# Patient Record
Sex: Male | Born: 1970 | Race: Black or African American | Hispanic: No | Marital: Single | Smoking: Never smoker
Health system: Southern US, Community
[De-identification: ages and names within clinical notes are randomized; demographics above are authoritative.]

## PROBLEM LIST (undated history)

## (undated) DIAGNOSIS — M1990 Unspecified osteoarthritis, unspecified site: Secondary | ICD-10-CM

## (undated) DIAGNOSIS — T7840XA Allergy, unspecified, initial encounter: Secondary | ICD-10-CM

## (undated) DIAGNOSIS — I10 Essential (primary) hypertension: Secondary | ICD-10-CM

## (undated) HISTORY — DX: Unspecified osteoarthritis, unspecified site: M19.90

## (undated) HISTORY — PX: OTHER SURGICAL HISTORY: SHX169

## (undated) HISTORY — DX: Essential (primary) hypertension: I10

## (undated) HISTORY — DX: Allergy, unspecified, initial encounter: T78.40XA

---
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
## 2000-02-16 ENCOUNTER — Emergency Department (HOSPITAL_COMMUNITY): Admission: EM | Admit: 2000-02-16 | Discharge: 2000-02-16 | Payer: Self-pay | Admitting: Emergency Medicine

## 2000-02-23 ENCOUNTER — Emergency Department (HOSPITAL_COMMUNITY): Admission: EM | Admit: 2000-02-23 | Discharge: 2000-02-23 | Payer: Self-pay | Admitting: Emergency Medicine

## 2005-12-09 ENCOUNTER — Emergency Department (HOSPITAL_COMMUNITY): Admission: EM | Admit: 2005-12-09 | Discharge: 2005-12-09 | Payer: Self-pay | Admitting: Emergency Medicine

## 2008-10-24 ENCOUNTER — Emergency Department (HOSPITAL_COMMUNITY): Admission: EM | Admit: 2008-10-24 | Discharge: 2008-10-24 | Payer: Self-pay | Admitting: Emergency Medicine

## 2012-02-19 ENCOUNTER — Emergency Department (HOSPITAL_BASED_OUTPATIENT_CLINIC_OR_DEPARTMENT_OTHER)
Admission: EM | Admit: 2012-02-19 | Discharge: 2012-02-19 | Disposition: A | Discharge status: Home / Self Care | Payer: Self-pay | Attending: Emergency Medicine | Admitting: Emergency Medicine

## 2012-02-19 ENCOUNTER — Encounter (HOSPITAL_BASED_OUTPATIENT_CLINIC_OR_DEPARTMENT_OTHER): Payer: Self-pay

## 2012-02-19 ENCOUNTER — Emergency Department (HOSPITAL_BASED_OUTPATIENT_CLINIC_OR_DEPARTMENT_OTHER): Payer: Self-pay

## 2012-02-19 DIAGNOSIS — S0530XA Ocular laceration without prolapse or loss of intraocular tissue, unspecified eye, initial encounter: Secondary | ICD-10-CM | POA: Insufficient documentation

## 2012-02-19 DIAGNOSIS — Y9301 Activity, walking, marching and hiking: Secondary | ICD-10-CM | POA: Insufficient documentation

## 2012-02-19 DIAGNOSIS — S0285XA Fracture of orbit, unspecified, initial encounter for closed fracture: Secondary | ICD-10-CM

## 2012-02-19 DIAGNOSIS — S0590XA Unspecified injury of unspecified eye and orbit, initial encounter: Secondary | ICD-10-CM

## 2012-02-19 DIAGNOSIS — F17200 Nicotine dependence, unspecified, uncomplicated: Secondary | ICD-10-CM | POA: Insufficient documentation

## 2012-02-19 DIAGNOSIS — Y929 Unspecified place or not applicable: Secondary | ICD-10-CM | POA: Insufficient documentation

## 2012-02-19 DIAGNOSIS — S04019A Injury of optic nerve, unspecified eye, initial encounter: Secondary | ICD-10-CM

## 2012-02-19 DIAGNOSIS — IMO0002 Reserved for concepts with insufficient information to code with codable children: Secondary | ICD-10-CM | POA: Insufficient documentation

## 2012-02-19 MED ORDER — CEFAZOLIN SODIUM 1-5 GM-% IV SOLN
1.0000 g | Freq: Three times a day (TID) | INTRAVENOUS | Status: DC
  Administered 2012-02-19: 1 g via INTRAVENOUS
  Filled 2012-02-19: qty 50

## 2012-02-19 MED ORDER — ACETAMINOPHEN 325 MG PO TABS
650.0000 mg | ORAL_TABLET | Freq: Four times a day (QID) | ORAL | Status: DC | PRN
  Administered 2012-02-19: 650 mg via ORAL
  Filled 2012-02-19: qty 2

## 2012-02-19 MED ORDER — SODIUM CHLORIDE 0.9 % IV SOLN
INTRAVENOUS | Status: DC
  Administered 2012-02-19: 08:00:00 via INTRAVENOUS

## 2012-02-19 MED ORDER — TOBRAMYCIN-DEXAMETHASONE 0.3-0.1 % OP SUSP: 1.0000 [drp] | OPHTHALMIC | Status: DC

## 2012-02-19 MED ORDER — TETANUS-DIPHTH-ACELL PERTUSSIS 5-2.5-18.5 LF-MCG/0.5 IM SUSP
0.5000 mL | Freq: Once | INTRAMUSCULAR | Status: AC
  Administered 2012-02-19: 0.5 mL via INTRAMUSCULAR
  Filled 2012-02-19: qty 0.5

## 2012-02-19 MED ORDER — OXYCODONE-ACETAMINOPHEN 5-325 MG PO TABS: 1.0000 | ORAL_TABLET | ORAL | Status: DC | PRN

## 2012-02-19 MED ORDER — ONDANSETRON HCL 4 MG/2ML IJ SOLN
4.0000 mg | Freq: Once | INTRAMUSCULAR | Status: AC
  Administered 2012-02-19: 4 mg via INTRAVENOUS

## 2012-02-19 MED ORDER — TOBRAMYCIN-DEXAMETHASONE 0.3-0.1 % OP SUSP
1.0000 [drp] | OPHTHALMIC | Status: DC
  Administered 2012-02-19: 1 [drp] via OPHTHALMIC
  Filled 2012-02-19: qty 2.5

## 2012-02-19 MED ORDER — ONDANSETRON HCL 4 MG/2ML IJ SOLN
INTRAMUSCULAR | Status: AC
  Administered 2012-02-19: 4 mg via INTRAVENOUS
  Filled 2012-02-19: qty 2

## 2012-02-19 MED ORDER — MORPHINE SULFATE 4 MG/ML IJ SOLN
4.0000 mg | Freq: Once | INTRAMUSCULAR | Status: AC
  Administered 2012-02-19: 4 mg via INTRAVENOUS
  Filled 2012-02-19: qty 1

## 2012-02-19 NOTE — ED Provider Notes (Signed)
Patient was transferred from [facility] to see ophthalmology. Seen in ER by Dr. Luciana Axe. He saw the patient primarily and the patient will be discharged  Juliet Rude. Rubin Payor, MD 02/19/12 1243

## 2012-02-19 NOTE — ED Notes (Signed)
Patient transported to CT 

## 2012-02-19 NOTE — ED Notes (Signed)
opthamology at bedside

## 2012-02-19 NOTE — ED Notes (Signed)
Pt has returned from radiology.  
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
## 2012-02-19 NOTE — ED Provider Notes (Signed)
Pt seen with resident  Pt here with eye injury He appears to have scleral laceration and CT imaging shows orbital fractures (no entrapment) and optic nerve tethering i have spoken to dr Luciana Axe - recommend IV antitbiotics, no pressure to eye and will see in [facility] ED today He does not recommend topical antibiotics D/[facility] who will [facility] patient  I will also call ENT for orbital fractures (d/w dr shoemaker, aware of patient)   Joya Gaskins, MD 02/19/12 [phone]
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
## 2012-02-19 NOTE — ED Provider Notes (Signed)
I have personally seen and examined the patient.  I have discussed the plan of care with the resident.  I have reviewed the documentation on PMH/FH/Soc. History.  I have reviewed the documentation of the resident and agree.    Joya Gaskins, MD 02/19/12 [phone]

## 2012-02-26 ENCOUNTER — Emergency Department (HOSPITAL_COMMUNITY): Payer: Self-pay

## 2012-02-26 ENCOUNTER — Emergency Department (HOSPITAL_COMMUNITY)
Admission: EM | Admit: 2012-02-26 | Discharge: 2012-02-27 | Disposition: A | Discharge status: Home / Self Care | Payer: Self-pay | Attending: Emergency Medicine | Admitting: Emergency Medicine

## 2012-02-26 DIAGNOSIS — R002 Palpitations: Secondary | ICD-10-CM | POA: Insufficient documentation

## 2012-02-26 DIAGNOSIS — Z7982 Long term (current) use of aspirin: Secondary | ICD-10-CM | POA: Insufficient documentation

## 2012-02-26 DIAGNOSIS — Z79899 Other long term (current) drug therapy: Secondary | ICD-10-CM | POA: Insufficient documentation

## 2012-02-26 DIAGNOSIS — F17200 Nicotine dependence, unspecified, uncomplicated: Secondary | ICD-10-CM | POA: Insufficient documentation

## 2012-02-26 DIAGNOSIS — I10 Essential (primary) hypertension: Secondary | ICD-10-CM | POA: Insufficient documentation

## 2012-02-26 MED ORDER — ASPIRIN 325 MG PO TABS
325.0000 mg | ORAL_TABLET | ORAL | Status: AC
  Administered 2012-02-26: 325 mg via ORAL
  Filled 2012-02-26: qty 1

## 2012-02-26 MED ORDER — METOPROLOL TARTRATE 50 MG PO TABS: 50.0000 mg | ORAL_TABLET | Freq: Two times a day (BID) | ORAL | Status: DC

## 2012-02-26 MED ORDER — ASPIRIN EC 325 MG PO TBEC: 325.0000 mg | DELAYED_RELEASE_TABLET | Freq: Every day | ORAL | Status: DC

## 2012-02-26 NOTE — ED Notes (Signed)
Patient is refusing labs to be drawn . Request to be discharged made ed physician aware. Discharge in process

## 2012-02-26 NOTE — ED Notes (Signed)
Correction to prior entry pt now states he doesn't have a headache

## 2012-02-26 NOTE — ED Provider Notes (Signed)
History     CSN: [id number]  Arrival date & time 02/26/12  Jose Mcgee   First MD Initiated Contact with Patient 02/26/12 2245      Chief Complaint  Patient presents with  . Hypertension    (Consider location/radiation/quality/duration/timing/severity/associated sxs/prior treatment) HPI This 42 year old male had a spell for about 15 minutes of palpitations felt as if his heart was racing with sudden onset and gradual resolution with no associated chest pain shortness breath lightheadedness focal neurologic symptoms or other concerns, since his blood pressure was recently elevated when he had eye trauma; he came to the ED tonight to get his blood pressure rechecked. There is no treatment prior to arrival. The patient is currently asymptomatic but he noticed while lying in the gurney with his significant other in the ED if he changes positions a certain way he notices some slight tenderness to his left upper chest wall. No past medical history on file.  No past surgical history on file.  No family history on file.  History  Substance Use Topics  . Smoking status: Current Some Day Smoker    Types: Cigarettes  . Smokeless tobacco: Never Used  . Alcohol Use: Yes      Review of Systems 10 Systems reviewed and are negative for acute change except as noted in the HPI. Allergies  Review of patient's allergies indicates no known allergies.  Home Medications   Current Outpatient Rx  Name  Route  Sig  Dispense  Refill  . oxyCODONE-acetaminophen (PERCOCET) 5-325 MG per tablet   Oral   Take 1-2 tablets by mouth every 4 (four) hours as needed for pain.   20 tablet   0   . tobramycin-dexamethasone (TOBRADEX) ophthalmic solution   Right Eye   Place 1 drop into the right eye every 4 (four) hours while awake.   5 mL   0   . aspirin EC 325 MG tablet   Oral   Take 1 tablet (325 mg total) by mouth daily.   30 tablet   0   . metoprolol (LOPRESSOR) 50 MG tablet   Oral   Take 1 tablet  (50 mg total) by mouth 2 (two) times daily.   60 tablet   0     BP 175/107  Pulse 66  Temp(Src) 97.4 F (36.3 C) (Oral)  Resp 20  SpO2 97%  Physical Exam  Nursing note and vitals reviewed. Constitutional:  Awake, alert, nontoxic appearance.  HENT:  Head: Atraumatic.  Eyes: Right eye exhibits no discharge. Left eye exhibits no discharge.  Neck: Neck supple.  Cardiovascular: Normal rate and regular rhythm.   No murmur heard. Pulmonary/Chest: Effort normal and breath sounds normal. No respiratory distress. He has no wheezes. He has no rales. He exhibits tenderness.  Slight tenderness to left upper outer chest wall near his distal clavicle reproduces his pain without rash or palpable deformity  Abdominal: Soft. There is no tenderness. There is no rebound.  Musculoskeletal: He exhibits no edema and no tenderness.  Baseline ROM, no obvious new focal weakness.  Neurological: He is alert.  Mental status and motor strength appears baseline for patient and situation.  Skin: No rash noted.  Psychiatric: He has a normal mood and affect.    ED Course  Procedures (including critical care time) ECG: Sinus rhythm, ventricular rate 78, normal axis, borderline prolonged QT interval, no acute ischemic changes noted, no comparison ECG available  Labs Reviewed - No data to display No results found.   1.  Palpitations   2. Hypertension       MDM  Pt stable in ED with no significant deterioration in condition.  Patient / Family / Caregiver informed of clinical course, understand medical decision-making process, and agree with plan.  I doubt any other EMC precluding discharge at this time including, but not necessarily limited to the following:Vtach.        Hurman Horn, MD 03/06/12 1710
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
## 2012-02-26 NOTE — ED Notes (Signed)
Pt states he does not want blood drawn til he can see his EKG. Nurse made aware

## 2012-02-26 NOTE — ED Notes (Signed)
Pt states [facility] and his blood pressure was high but he states he wasn't told anything about following up on his high blood pressure,  Pt has a Headache,  He is alert and oriented

## 2014-09-27 IMAGING — CR DG CHEST 2V
2 series · 2 of 2 positions shown · non-contrast
Comparison: No priors.

CLINICAL DATA: Chest pain and palpitations.  Hypertension.

CHEST - 2 VIEW

[w chest pa]
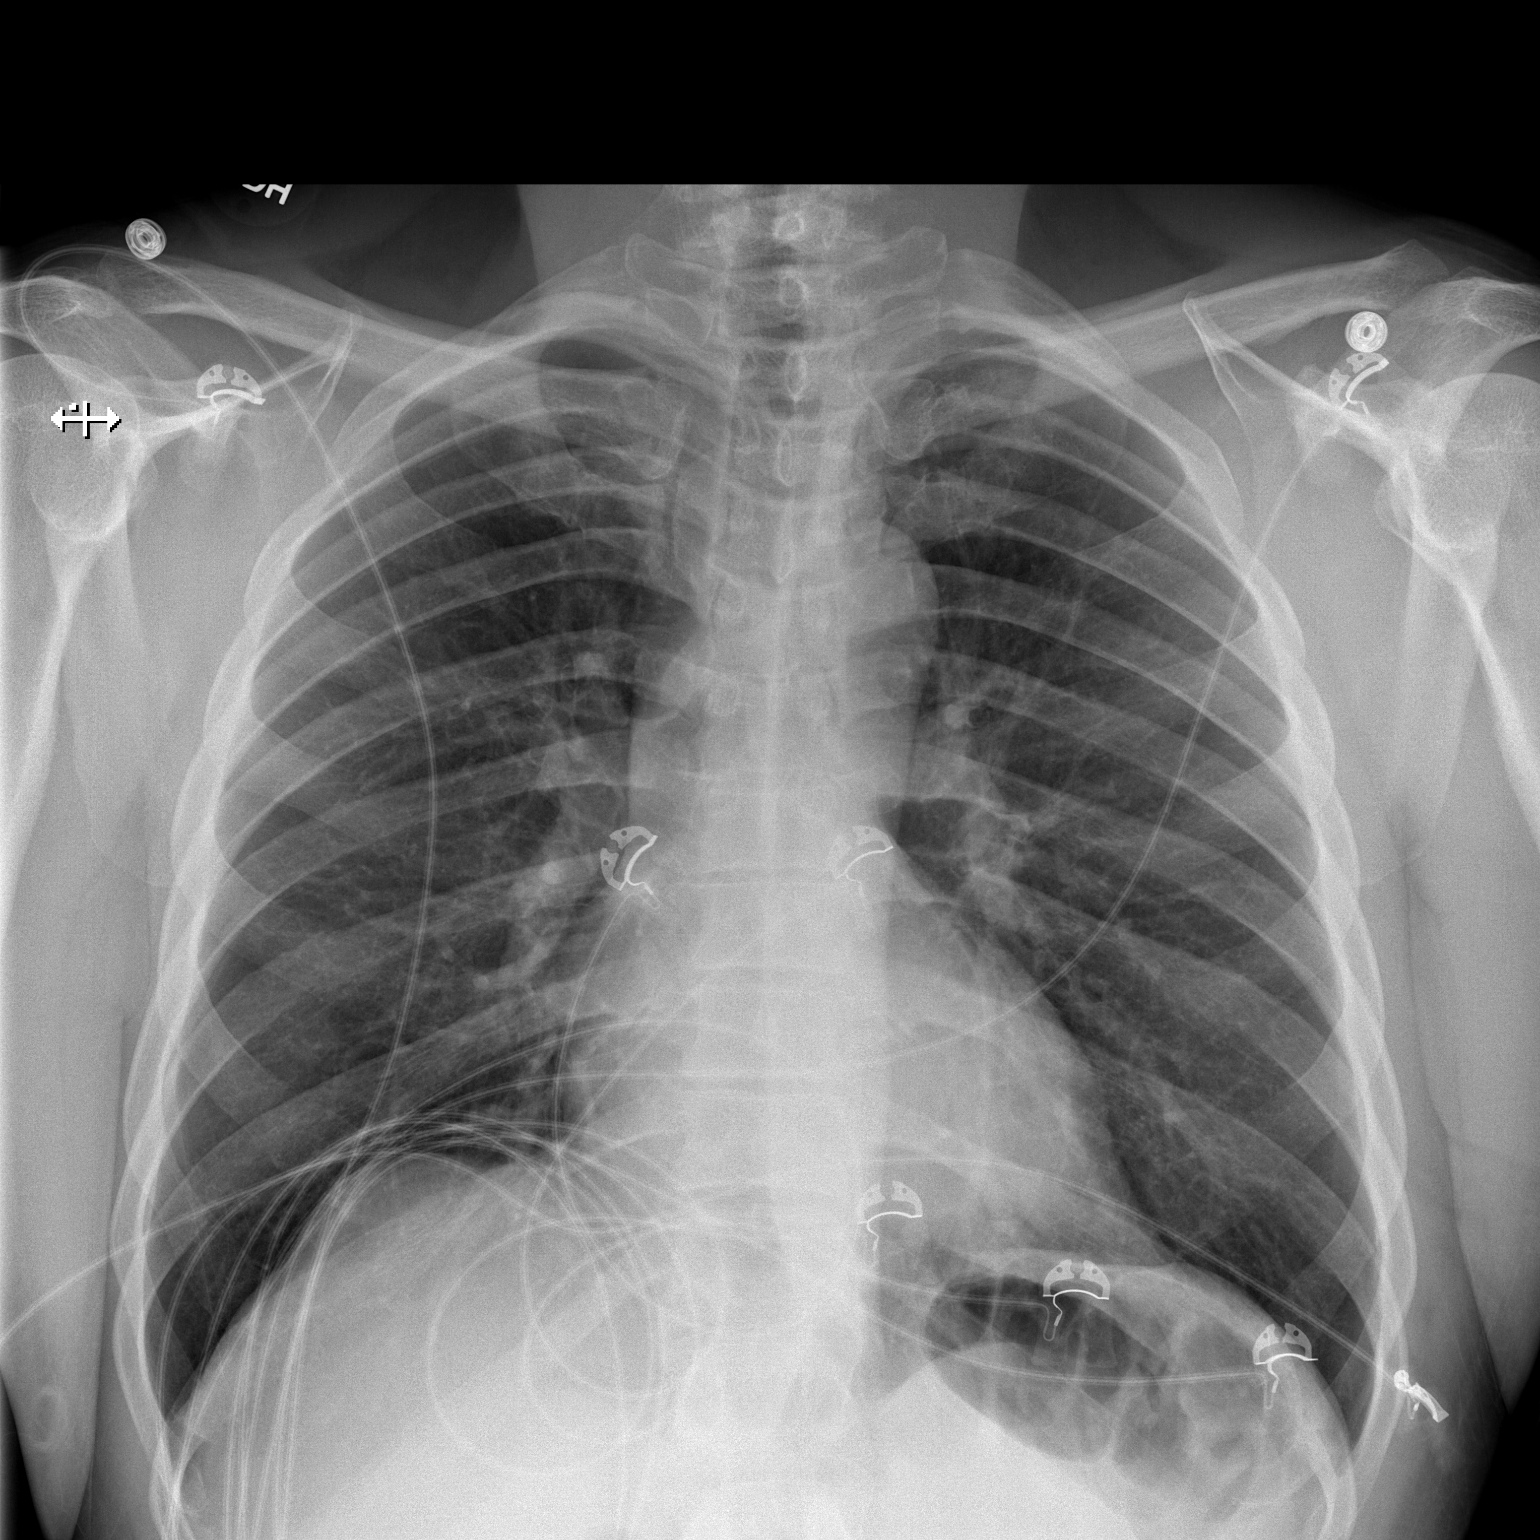

[w chest lat]
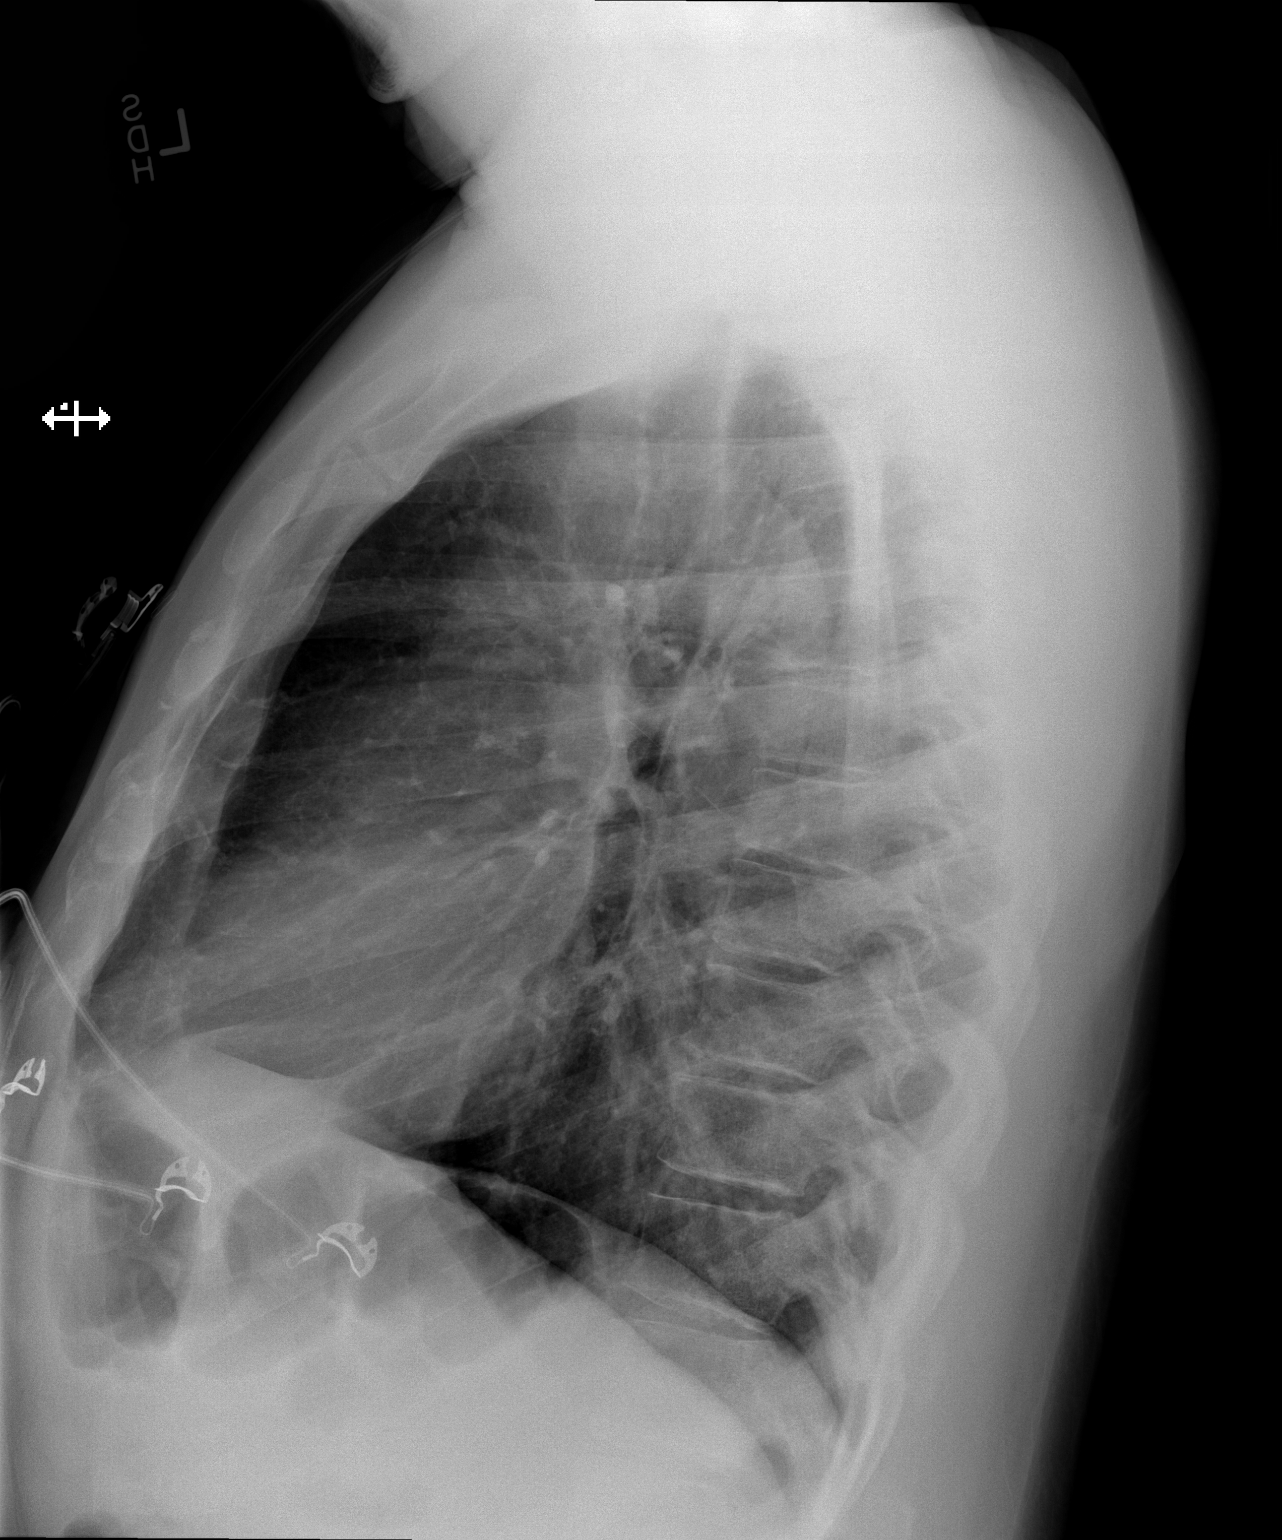

[2 of 2 positions shown; findings below may reference images not displayed]

FINDINGS: Lung volumes are normal.  No consolidative airspace
disease.  No pleural effusions.  No pneumothorax.  No pulmonary
nodule or mass noted.  Pulmonary vasculature and the
cardiomediastinal silhouette are within normal limits.
Atherosclerosis of the thoracic aorta.
IMPRESSION: 1. No radiographic evidence of acute cardiopulmonary disease.
2.  Atherosclerosis.

## 2014-11-01 IMAGING — CT CT HEAD W/O CM
1 series · 15 of 30 positions shown, 19 images · non-contrast
Comparison: None.

CT HEAD

CLINICAL DATA: Head trauma.  Facial trauma.  Laceration and
swelling to the right side of the face.

CT HEAD WITHOUT CONTRAST
CT MAXILLOFACIAL WITHOUT CONTRAST
TECHNIQUE: Multidetector CT imaging of the head and maxillofacial
structures were performed using the standard protocol without
intravenous contrast. Multiplanar CT image reconstructions of the
maxillofacial structures were also generated.

[Series 2: head 4.8 h37s · axial · 0.47mm/px · z∈[-187,-27]mm · 15 of 36 slices shown, 19 images]
[im 2/36  brain]
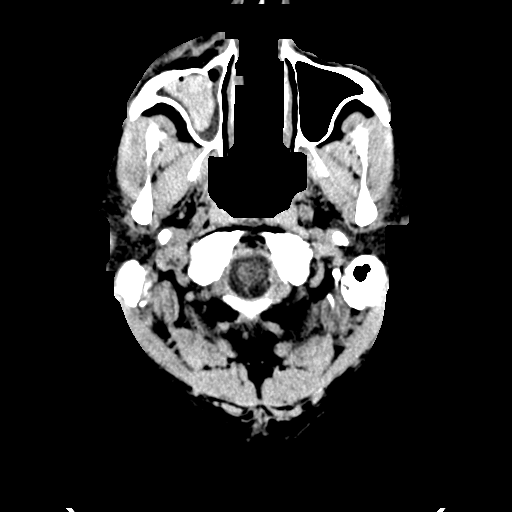
[im 2/36  bone]
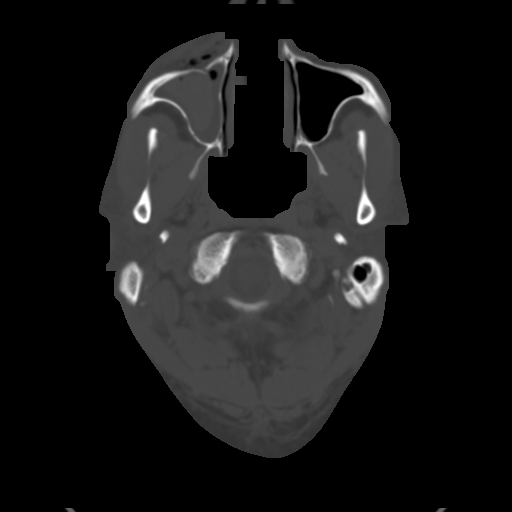
[im 4/36  brain]
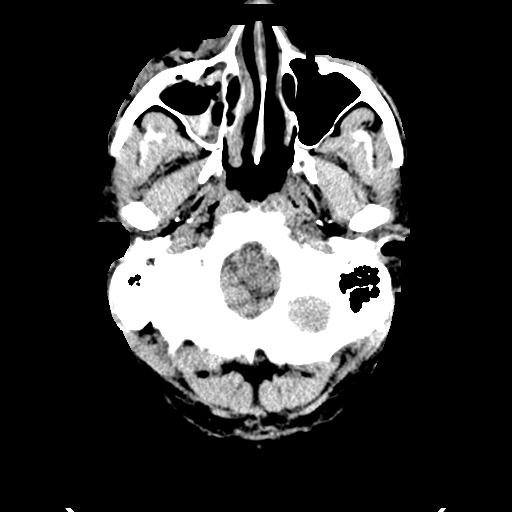
[im 7/36  brain]
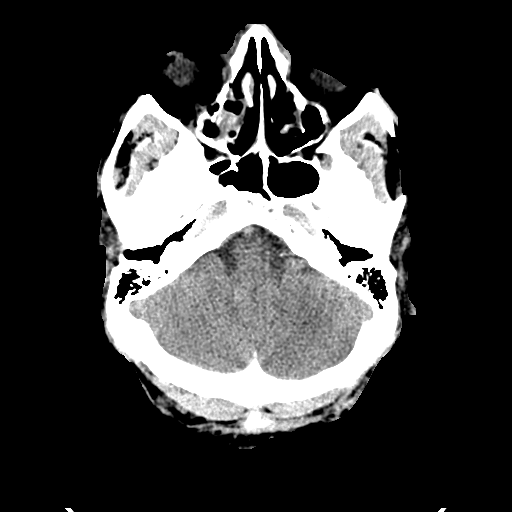
[im 9/36  brain]
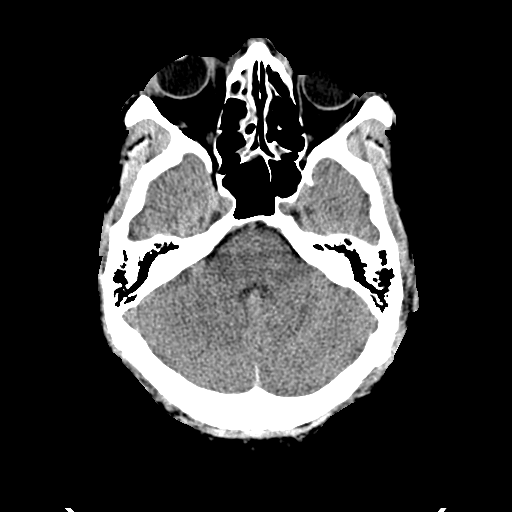
[im 11/36  brain]
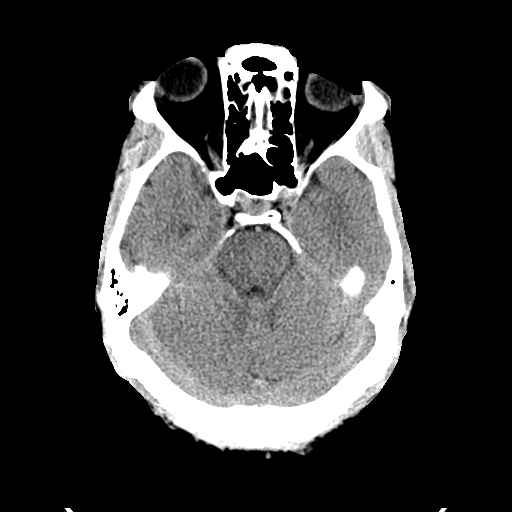
[im 11/36  bone]
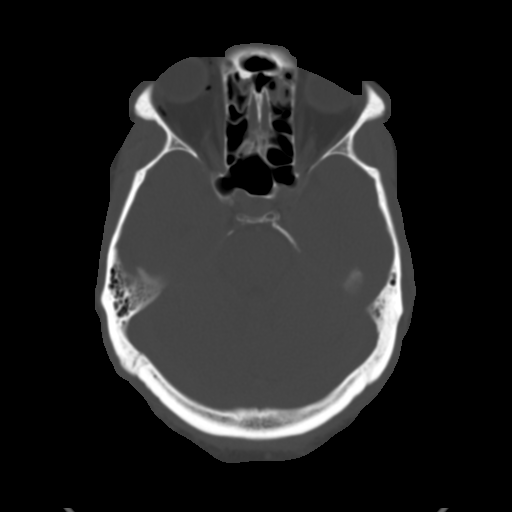
[im 14/36  brain]
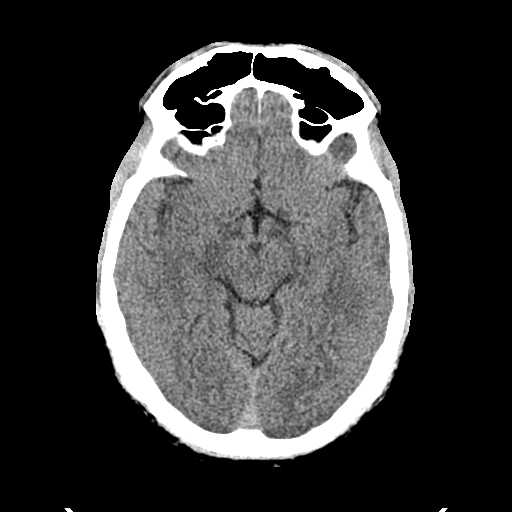
[im 16/36  brain]
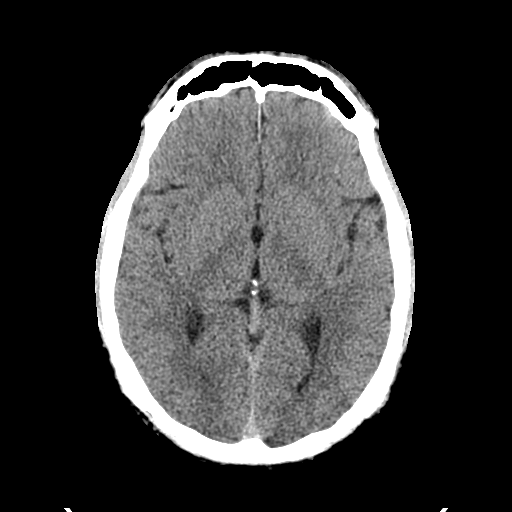
[im 19/36  brain]
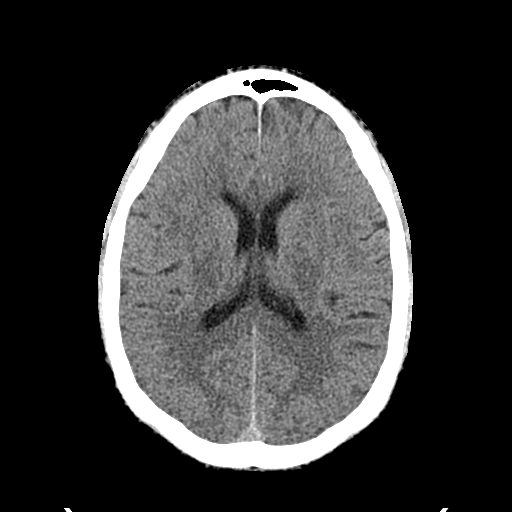
[im 20/36  brain]
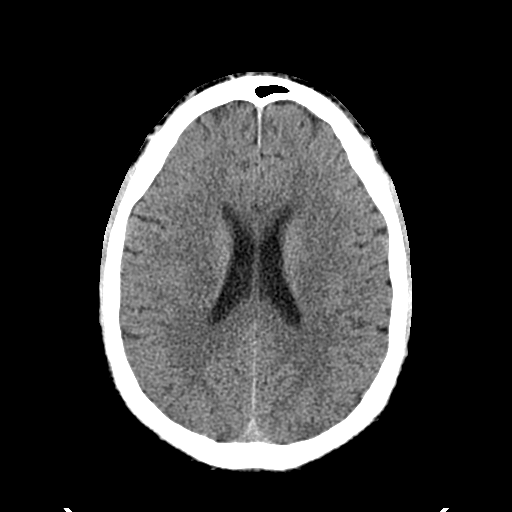
[im 20/36  bone]
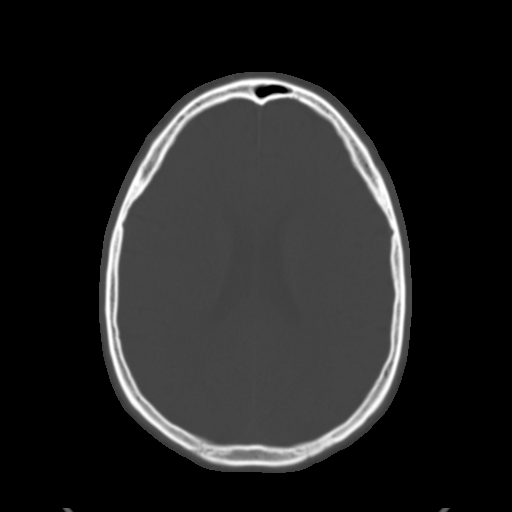
[im 22/36  brain]
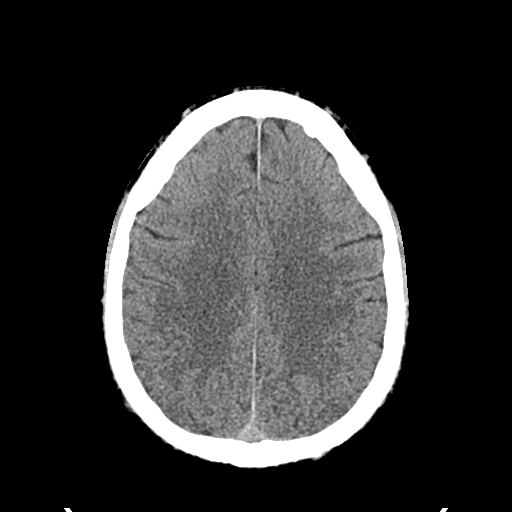
[im 25/36  brain]
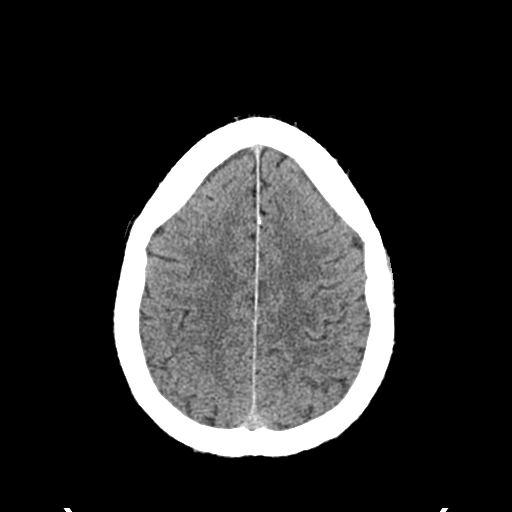
[im 27/36  brain]
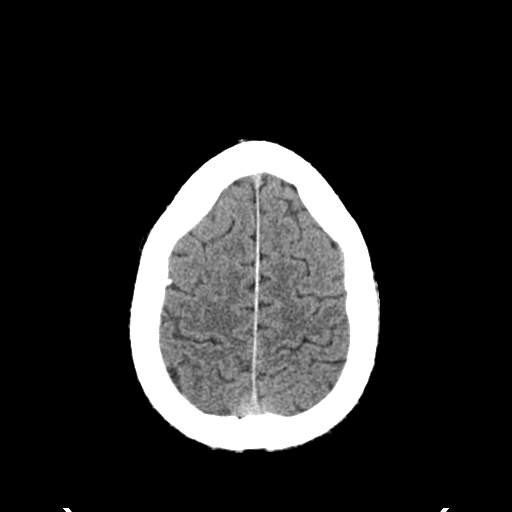
[im 29/36  brain]
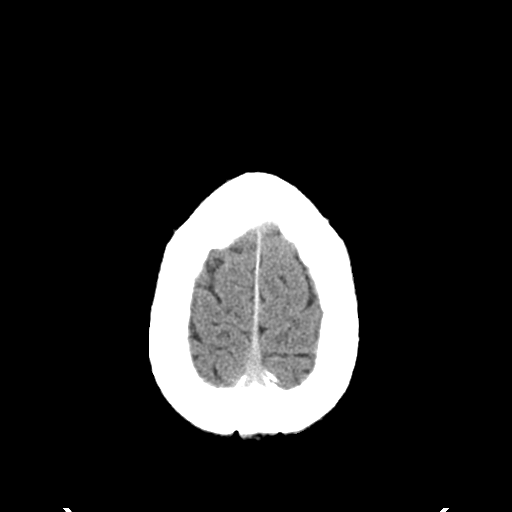
[im 29/36  bone]
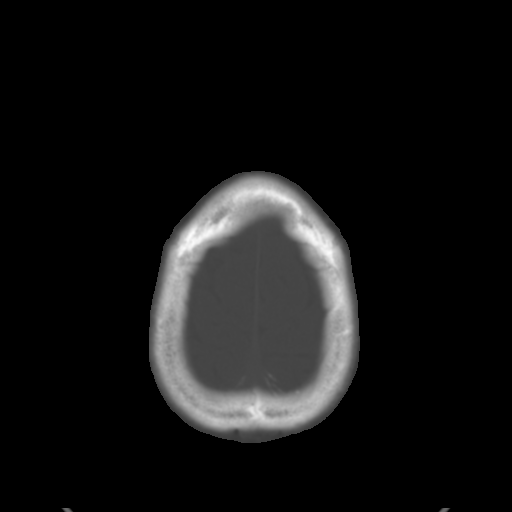
[im 32/36  brain]
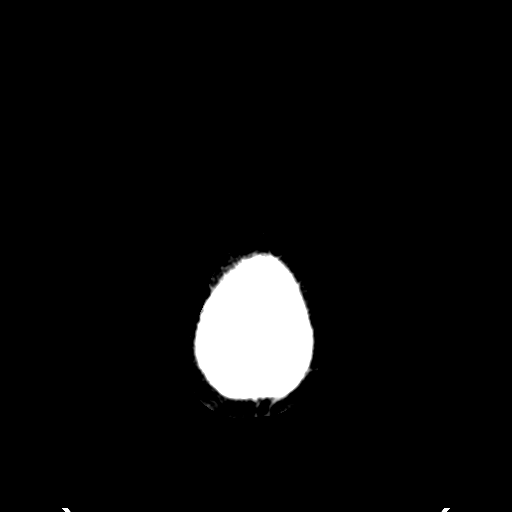
[im 34/36  brain]
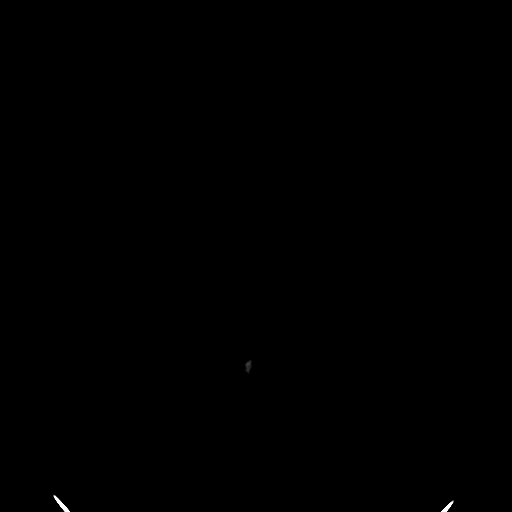

[15 of 30 positions shown; findings below may reference images not displayed]

FINDINGS: No mass lesion, mass effect, midline shift,
hydrocephalus, hemorrhage.  No territorial ischemia or acute
infarction.  Calvarium intact.
IMPRESSION: No acute intracranial abnormality.

CT MAXILLOFACIAL
FINDINGS: Severe soft tissue emphysema is present in the right
side of the face and in the preseptal periorbital soft tissues as
well as the right orbital fat.  Right-sided exophthalmos is
present.  The globe appears intact.  The lens is located.
Disconjugate gaze is incidentally noted.  There is straightening of
the right optic nerve, compatible with at least some tethering
associated with the exophthalmos.

The medial orbital wall and lateral orbital wall of the right orbit
are intact.  There is a minimally displaced right orbital floor
fracture.  Minimally depressed fracture of the anterior wall of the
right maxillary sinus is also present.  Right maxillary hemosinus.
Zygomatic arch appears intact.  Mandibular condyles located.
Pterygoid plates intact.  The severe carious dentition is present
throughout.  Consider follow-up dental consultation.  The
visualized cervical spine shows degenerative changes without
fracture.  Craniocervical alignment is within normal limits.
Mandible appears intact. Scattered opacification of the ethmoid air
cells.  Radiodense structure is present in the mouth, compatible
with chewing gum.

No nasal bone fracture.  Swelling over the right medial canthus and
right side of the bridge of the nose.  No intraorbital or
intraconal hematoma identified.
IMPRESSION: Mildly depressed right maxillary sinus anterior wall fracture with
right maxillary hemosinus.  Minimally displaced right orbital floor
fracture.  No extraocular muscle entrapment.  Severe periorbital
and orbital emphysema with right exophthalmos and some tethering of
the right optic nerve.

## 2022-02-15 ENCOUNTER — Encounter: Payer: Self-pay | Admitting: Family Medicine

## 2022-02-15 ENCOUNTER — Ambulatory Visit (INDEPENDENT_AMBULATORY_CARE_PROVIDER_SITE_OTHER): Payer: Medicaid Other | Admitting: Family Medicine

## 2022-02-15 VITALS — BP 144/88 | HR 71 | Ht 70.0 in | Wt 280.0 lb

## 2022-02-15 DIAGNOSIS — T7840XA Allergy, unspecified, initial encounter: Secondary | ICD-10-CM | POA: Insufficient documentation

## 2022-02-15 DIAGNOSIS — Z1211 Encounter for screening for malignant neoplasm of colon: Secondary | ICD-10-CM | POA: Diagnosis not present

## 2022-02-15 DIAGNOSIS — Z1159 Encounter for screening for other viral diseases: Secondary | ICD-10-CM | POA: Diagnosis not present

## 2022-02-15 DIAGNOSIS — Z114 Encounter for screening for human immunodeficiency virus [HIV]: Secondary | ICD-10-CM

## 2022-02-15 DIAGNOSIS — I10 Essential (primary) hypertension: Secondary | ICD-10-CM

## 2022-02-15 DIAGNOSIS — Z131 Encounter for screening for diabetes mellitus: Secondary | ICD-10-CM | POA: Diagnosis not present

## 2022-02-15 DIAGNOSIS — Z7689 Persons encountering health services in other specified circumstances: Secondary | ICD-10-CM

## 2022-02-15 LAB — POCT GLYCOSYLATED HEMOGLOBIN (HGB A1C): Hemoglobin A1C: 5.2 % (ref 4.0–5.6)

## 2022-02-15 MED ORDER — AMLODIPINE BESYLATE 2.5 MG PO TABS: 2.5000 mg | ORAL_TABLET | Freq: Every day | ORAL | 3 refills | Status: DC

## 2022-02-15 NOTE — Patient Instructions (Addendum)
It was great to see you today! Here's what we talked about:  We collected blood work. I will follow up these results with you. I have referred you for colonoscopy. They will call you to make an appointment. I have given you some dental resources that you can call and get an appointment with. I have sent in amlodipine 2.5 mg. Take this daily for your blood pressure. Let me know if you have any side effects.  Please let me know if you have any other questions.  Dr. Marcha Dutton  Dental list         Updated 11.20.18 These dentists all accept Medicaid.  The list is a courtesy and for your convenience. Estos dentistas aceptan Medicaid.  La lista es para su [place] y es una cortesa.     [facility]     [phone] [place] [address] Se habla espaol From 41 to 4 years old Parent may go with child only for cleaning Anette Riedel DDS     [place], [place] speaking) [address] [place]  [address] Se habla espaol From 68 to 83 years old Parent may go with child   Rolene Arbour DMD    [id number] [place] [address] Se habla espaol Vietnamese spoken From 31 years old Parent may go with child [facility]     [phone] [place] [address] Se habla espaol From 39 to 62 years old Parent may NOT go with child  Marcelo Baldy DDS  [phone] [facility]      [address]  [place] [address] [place] spoken (preferred to bring translator) From teeth coming in to 66 years old Parent may go with child  [facility]     [phone] [address] [place]. [place] [id number] Requires certification. Call for information. Requiere certificacin. Llame para informacin. Algunos dias se habla espaol  From birth to 54 years Parent possibly goes with child   Kandice Hams DDS     [place]  [address] [place] [address] Se habla espaol From 18 months to 18 years  Parent may go with child  [facility]. [facility] DDS     Merry Proud DDS  [phone] [address] [place] [address] Se habla espaol From 79 year old Parent may go with child   Shelton Silvas DDS    [phone] [address] [place] [address] Se habla espaol  From 41 months to 5 years old Parent may go with child Ivory Broad DDS    [phone] [address] [place] [address] Se habla espaol From 49 to 67 years old Parent may go with child  [facility]    [phone] [address] [place] [address] No se Joneen Caraway From birth [facility]  [phone] [address] [place] [address] Se habla espanol Interpretation for other languages Special needs children welcome  Moss Mc, DDS PA     [phone] [place].  [place], [place] [address] From 54 years old   Special needs children welcome  [facility]   [phone] Dr. Janeice Robinson [address] [place], [place] [address] Se habla espaol From birth to 55 years Special needs children welcome   [facility] [place] [phone] [address] [place],  [address]   [facility]  [place] [id number] [place], [place] [address]

## 2022-02-15 NOTE — Assessment & Plan Note (Signed)
BP elevated today.  He is not on any medications.  Will start amlodipine 2.5 mg daily.  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  Will follow-up in 2 weeks to assess response.

## 2022-02-15 NOTE — Progress Notes (Signed)
    SUBJECTIVE:   CHIEF COMPLAINT / HPI:   New patient PMH: HTN (reports triple digits systolic and diastolic at home with wrist monitor), dental caries, hx ED while on BP meds, no hospitalizations, had inguinal hernia repair as a baby and lipoma removal on head Meds: None though was on lisinopril and metoprolol in past but did not like how he felt with these, has also been on Viagra and amlodipine in the past Allergies: sometimes has congestion with milk products FH: mom with HTN, DM; grandfather with DM SH: lives in [place] with sister, social drinker (binging with friends in the past) but has not drank in 2 months, no tobacco, no other substance use, sexually active with 1 male partner, eight children (58-33 in age)  OBJECTIVE:   BP (!) 144/88   Pulse 71   Ht 5\' 10"  (1.778 m)   Wt 280 lb (127 kg)   SpO2 98%   BMI 40.18 kg/m   General: Alert and oriented, in NAD Skin: Warm, dry, and intact without lesions HEENT: NCAT, EOM grossly normal, midline nasal septum Cardiac: RRR, no m/r/g appreciated Respiratory: CTAB, breathing and speaking comfortably on RA Abdominal: Soft, nontender, nondistended, normoactive bowel sounds Extremities: Moves all extremities grossly equally Neurological: No gross focal deficit Psychiatric: Appropriate mood and affect  ASSESSMENT/PLAN:   Hypertension BP elevated today.  He is not on any medications.  Will start amlodipine 2.5 mg daily.  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  Will follow-up in 2 weeks to assess response.   Health maintenance Provided dental list for medicaid. Denies flu, COVID, shingrix vaccines. Obtained baseline CBC, CMP, lipid panel, A1c. Referred for colonoscopy. Obtained HIV and HCV screening today.  Ethelene Hal, MD [place]

## 2022-02-16 LAB — LIPID PANEL
Chol/HDL Ratio: 3.4 ratio (ref 0.0–5.0)
Cholesterol, Total: 176 mg/dL (ref 100–199)
HDL: 52 mg/dL (ref >39)
LDL Chol Calc (NIH): 109 mg/dL — ABNORMAL HIGH (ref 0–99)
Triglycerides: 83 mg/dL (ref 0–149)
VLDL Cholesterol Cal: 15 mg/dL (ref 5–40)

## 2022-02-16 LAB — COMPREHENSIVE METABOLIC PANEL
ALT: 16 IU/L (ref 0–44)
AST: 17 IU/L (ref 0–40)
Albumin/Globulin Ratio: 1.5 (ref 1.2–2.2)
Albumin: 4.4 g/dL (ref 3.8–4.9)
Alkaline Phosphatase: 73 IU/L (ref 44–121)
BUN/Creatinine Ratio: 7 — ABNORMAL LOW (ref 9–20)
BUN: 9 mg/dL (ref 6–24)
Bilirubin Total: 0.5 mg/dL (ref 0.0–1.2)
CO2: 25 mmol/L (ref 20–29)
Calcium: 9.5 mg/dL (ref 8.7–10.2)
Chloride: 104 mmol/L (ref 96–106)
Creatinine, Ser: 1.3 mg/dL — ABNORMAL HIGH (ref 0.76–1.27)
Globulin, Total: 2.9 g/dL (ref 1.5–4.5)
Glucose: 89 mg/dL (ref 70–99)
Potassium: 4.1 mmol/L (ref 3.5–5.2)
Sodium: 141 mmol/L (ref 134–144)
Total Protein: 7.3 g/dL (ref 6.0–8.5)
eGFR: 66 mL/min/{1.73_m2} (ref >59)

## 2022-02-16 LAB — CBC WITH DIFFERENTIAL/PLATELET
Basophils Absolute: 0.1 10*3/uL (ref 0.0–0.2)
Basos: 1 %
EOS (ABSOLUTE): 0.2 10*3/uL (ref 0.0–0.4)
Eos: 4 %
Hematocrit: 45.2 % (ref 37.5–51.0)
Hemoglobin: 15.6 g/dL (ref 13.0–17.7)
Immature Grans (Abs): 0 10*3/uL (ref 0.0–0.1)
Immature Granulocytes: 0 %
Lymphocytes Absolute: 2.5 10*3/uL (ref 0.7–3.1)
Lymphs: 41 %
MCH: 30.6 pg (ref 26.6–33.0)
MCHC: 34.5 g/dL (ref 31.5–35.7)
MCV: 89 fL (ref 79–97)
Monocytes Absolute: 0.3 10*3/uL (ref 0.1–0.9)
Monocytes: 5 %
Neutrophils Absolute: 3 10*3/uL (ref 1.4–7.0)
Neutrophils: 49 %
Platelets: 221 10*3/uL (ref 150–450)
RBC: 5.1 x10E6/uL (ref 4.14–5.80)
RDW: 12.8 % (ref 11.6–15.4)
WBC: 6.2 10*3/uL (ref 3.4–10.8)

## 2022-02-16 LAB — HIV ANTIBODY (ROUTINE TESTING W REFLEX): HIV Screen 4th Generation wRfx: NONREACTIVE

## 2022-02-16 LAB — HEPATITIS C ANTIBODY: Hep C Virus Ab: NONREACTIVE

## 2022-02-22 ENCOUNTER — Encounter: Payer: Self-pay | Admitting: Internal Medicine

## 2022-03-03 ENCOUNTER — Ambulatory Visit (INDEPENDENT_AMBULATORY_CARE_PROVIDER_SITE_OTHER): Payer: Medicaid Other | Admitting: Family Medicine

## 2022-03-03 ENCOUNTER — Encounter: Payer: Self-pay | Admitting: Family Medicine

## 2022-03-03 VITALS — BP 150/90 | HR 79 | Wt 275.0 lb

## 2022-03-03 DIAGNOSIS — R7989 Other specified abnormal findings of blood chemistry: Secondary | ICD-10-CM

## 2022-03-03 DIAGNOSIS — M545 Low back pain, unspecified: Secondary | ICD-10-CM | POA: Diagnosis not present

## 2022-03-03 DIAGNOSIS — M6740 Ganglion, unspecified site: Secondary | ICD-10-CM | POA: Diagnosis not present

## 2022-03-03 DIAGNOSIS — M67479 Ganglion, unspecified ankle and foot: Secondary | ICD-10-CM | POA: Diagnosis not present

## 2022-03-03 DIAGNOSIS — B360 Pityriasis versicolor: Secondary | ICD-10-CM

## 2022-03-03 DIAGNOSIS — I10 Essential (primary) hypertension: Secondary | ICD-10-CM | POA: Diagnosis not present

## 2022-03-03 DIAGNOSIS — G8929 Other chronic pain: Secondary | ICD-10-CM | POA: Diagnosis not present

## 2022-03-03 DIAGNOSIS — T7840XD Allergy, unspecified, subsequent encounter: Secondary | ICD-10-CM

## 2022-03-03 HISTORY — DX: Pityriasis versicolor: B36.0

## 2022-03-03 MED ORDER — AMLODIPINE BESYLATE 2.5 MG PO TABS: 2.5000 mg | ORAL_TABLET | Freq: Every day | ORAL | 3 refills | Status: DC

## 2022-03-03 MED ORDER — DICLOFENAC SODIUM 1 % EX GEL: 2.0000 g | Freq: Two times a day (BID) | CUTANEOUS | 0 refills | Status: DC

## 2022-03-03 MED ORDER — LORATADINE 10 MG PO TABS: 10.0000 mg | ORAL_TABLET | Freq: Every day | ORAL | 11 refills | Status: DC

## 2022-03-03 MED ORDER — FLUCONAZOLE 150 MG PO TABS: 300.0000 mg | ORAL_TABLET | ORAL | 0 refills | Status: AC

## 2022-03-03 NOTE — Assessment & Plan Note (Signed)
Uncontrolled. Has been taking large amounts of NSAIDs for this. Reassured by lack of neurological symptoms. Likely muscular in origin. Provided back book for stretching exercises and recommended voltaren gel and tylenol prn for pain. If refractory at next visit, could consider PT.

## 2022-03-03 NOTE — Assessment & Plan Note (Addendum)
Remains uncontrolled without medication. Sent in amlodipine 2.5 mg to correct pharmacy (hold off on ACE/ARB given Cr bump). Follow up in 2 weeks to assess improvement.

## 2022-03-03 NOTE — Assessment & Plan Note (Signed)
Exam consistent with tinea versicolor. Has improvement in past with antifungal. After discussion with Dr. Gwendlyn Deutscher and area affected, will prescribe diflucan 300 mg weekly for 2 doses. Will follow up response at next visit.

## 2022-03-03 NOTE — Patient Instructions (Addendum)
It was great to see you today! Here's what we talked about:  Be sure to pick up your amlodipine from [facility]. Let me know if you cannot get it. Use the back book and voltaren gel on your back for stretches and pain control. You can also use tylenol 1000 mg every 6 hours as needed. Do not take this dose for more than 2 weeks. I will refer you to podiatry for your foot. I think this could be a cyst that could need drainage. I think your skin rash is due to a fungus. I have sent in diflucan to be taken one pill every 72 hours for 3 pills. Take claritin daily to see if that helps your congestion in the mornings. Make an appointment with me in 2 weeks to check on how you're doing for follow up.  Please let me know if you have any other questions.  Dr. Marcha Dutton

## 2022-03-03 NOTE — Assessment & Plan Note (Signed)
Cr 1.30 with GFR 66 on recent recheck. Patient with history of uncontrolled HTN and frequent NSAID use. Advised patient to stop using oral NSAIDs and stick to topical diclofenac and tylenol for pain relief. Plan to recheck CMP after BP is better controlled and NSAID cessation to assess renal function.

## 2022-03-03 NOTE — Assessment & Plan Note (Addendum)
Exam appears consistent with ganglion cyst of talocrural joint. Do not suspect septic joint given lack of systemic symptoms or erythema/warmth of ankle. Given presence of pain, discussed with Dr. Gwendlyn Deutscher who recommended referral to podiatry for further evaluation and potential aspiration. Recommended tylenol and voltaren gel over area for pain relief and to stop using NSAIDs given elevated creatinine.

## 2022-03-03 NOTE — Assessment & Plan Note (Signed)
Feel congestion in mornings without other symptoms most likely allergies. He is not on antihistamine. Discussed flonase and claritin; patient would like to try daily claritin first and assess response. Rx sent.

## 2022-03-03 NOTE — Progress Notes (Signed)
SUBJECTIVE:   CHIEF COMPLAINT / HPI:   Back pain Few years. At lower back. Was in moving business in the past. Pain is when he sits in car/chair and when he is laying down. Pain feels deep and can be dull/sharp. Feels better with pressure. Has BC/goody powders x3 a day but will take tylenol/aspirin occasionally. Has not tried voltaren gel. No difficulty walking. No numbness or weakness in legs with this pain. No trauma. No history of IVDU.  Skin rash Came back recently after trying new moisturizer on his hair. Had about a year ago for which you were treated with a cream that took it away. He said it was a cream for a fungus. The rash is not itchy or painful. Nobody else he knows with this. No f/n/v/d/c.  HTN Has not been able to get medications because he was unsure of where the medication was sent to. He denies any symptoms with this.  Congestion in mornings After the morning is over, this goes away, but this is bothersome for him. He has not taken any allergy meds in the past but has taken cold medications. Denies any sick symptoms as above.  R ankle pain Present for multiple years but worsening. Played a lot of basketball in the past. Has noticed the ankle is swollen most of the time and gets worse when he drives. It is painful to the touch and to walk on. The pain oftentimes will travel up his leg to his mid-shin. He has not had any difficulty walking and has been able to move the ankle just fine.  OBJECTIVE:   BP (!) 150/90   Pulse 79   Wt 275 lb (124.7 kg)   SpO2 98%   BMI 39.46 kg/m   General: Alert and oriented, in NAD Skin: Warm, dry; multiple hyperpigmented, scaly macules scattered along neckline without drainage or bleeding HEENT: NCAT, EOM grossly normal, midline nasal septum Respiratory: Breathing and speaking comfortably on RA Back/Neuro: TTP over L>R paraspinal muscles, no tenderness over spinal column, strength and sensation WNL in upper and lower extremities  bilaterally, ambulates well without difficulty Extremities: Right ankle with noticeable swelling along lateral talocrural joint that is fluctuant and becomes more tense with foot inversion, no overlying erythema or excessive warmth; left ankle with much less pronounced fluctuance over joint line Psychiatric: Appropriate mood and affect    ASSESSMENT/PLAN:   Hypertension Remains uncontrolled without medication. Sent in amlodipine 2.5 mg to correct pharmacy (hold off on ACE/ARB given Cr bump). Follow up in 2 weeks to assess improvement.  Allergies Feel congestion in mornings without other symptoms most likely allergies. He is not on antihistamine. Discussed flonase and claritin; patient would like to try daily claritin first and assess response. Rx sent.  Chronic lower back pain Uncontrolled. Has been taking large amounts of NSAIDs for this. Reassured by lack of neurological symptoms. Likely muscular in origin. Provided back book for stretching exercises and recommended voltaren gel and tylenol prn for pain. If refractory at next visit, could consider PT.  Tinea versicolor Exam consistent with tinea versicolor. Has improvement in past with antifungal. After discussion with Dr. Gwendlyn Deutscher and area affected, will prescribe diflucan 300 mg weekly for 2 doses. Will follow up response at next visit.  Ganglion cyst Exam appears consistent with ganglion cyst of talocrural joint. Do not suspect septic joint given lack of systemic symptoms or erythema/warmth of ankle. Given presence of pain, discussed with Dr. Gwendlyn Deutscher who recommended referral to podiatry for further  evaluation and potential aspiration. Recommended tylenol and voltaren gel over area for pain relief and to stop using NSAIDs given elevated creatinine.  Elevated serum creatinine Cr 1.30 with GFR 66 on recent recheck. Patient with history of uncontrolled HTN and frequent NSAID use. Advised patient to stop using oral NSAIDs and stick to topical  diclofenac and tylenol for pain relief. Plan to recheck CMP after BP is better controlled and NSAID cessation to assess renal function.   Health maintenance Colonoscopy scheduled for 04/23/2022.  Ethelene Hal, MD [place]

## 2022-03-05 ENCOUNTER — Ambulatory Visit (INDEPENDENT_AMBULATORY_CARE_PROVIDER_SITE_OTHER): Payer: Medicaid Other

## 2022-03-05 ENCOUNTER — Ambulatory Visit: Payer: Medicaid Other

## 2022-03-05 ENCOUNTER — Encounter: Payer: Self-pay | Admitting: Podiatry

## 2022-03-05 ENCOUNTER — Ambulatory Visit (INDEPENDENT_AMBULATORY_CARE_PROVIDER_SITE_OTHER): Payer: Medicaid Other | Admitting: Podiatry

## 2022-03-05 DIAGNOSIS — M67471 Ganglion, right ankle and foot: Secondary | ICD-10-CM | POA: Diagnosis not present

## 2022-03-05 DIAGNOSIS — M659 Synovitis and tenosynovitis, unspecified: Secondary | ICD-10-CM | POA: Diagnosis not present

## 2022-03-05 DIAGNOSIS — M19079 Primary osteoarthritis, unspecified ankle and foot: Secondary | ICD-10-CM | POA: Diagnosis not present

## 2022-03-05 DIAGNOSIS — M24071 Loose body in right ankle: Secondary | ICD-10-CM | POA: Diagnosis not present

## 2022-03-05 NOTE — Progress Notes (Signed)
Subjective:  Patient ID: Jose Mcgee, male    DOB: 08/08/1970,  MRN: [id number]  Chief Complaint  Patient presents with   Ganglion Cyst    Ganglion cyst of foot Referring Provider: Kinnie Feil    52 y.o. male presents with concern for painful soft tissue mass in the right foot as well as ankle pain.  He says it causes him pain with wearing certain shoes and boots.  Specially after he been walking.  Denies any acute injury.  He does not lift heavy furniture and goes up and down stairs a lot for work.  He says his problem is slightly getting worse in time.  He feels like he has a deep pain in his right ankle which is more the source of his pain then the superficial area that feels like it is a mobile soft tissue mass.  History reviewed. No pertinent past medical history.  No Known Allergies  ROS: Negative except as per HPI above  Objective:  General: AAO x3, NAD  Dermatological: Palpable soft tissue mass at the dorsal lateral aspect of the right ankle.  Pain with palpation in the area  Vascular:  Dorsalis Pedis artery and Posterior Tibial artery pedal pulses are 2/4 bilateral.  Capillary fill time < 3 sec to all digits.   Neruologic: Grossly intact via light touch bilateral. Protective threshold intact to all sites bilateral.   Musculoskeletal: Painful range of motion of the right ankle with pain on palpation of the tibiotalar joint especially at the lateral shoulder.  Crepitus noted with range of motion.  Pain with deep palpation of this area.  Gait: Unassisted, Nonantalgic.   No images are attached to the encounter.  Radiographs:  Date: 03/05/2022 XR the right foot and ankle weightbearing AP/Lateral/Oblique   Findings: Attention directed to the tibiotalar joint on ankle x-rays there is noted to be significant osteoarthritic changes of the joint with decreased joint space narrowing subchondral cyst formation subchondral sclerosis And synostosis of the syndesmosis.  Attention  directed the dorsal lateral talar body there is noted to be possible osteochondral defect present at this location with subchondral sclerosis.  Distal fibula also with some abnormality but no obvious acute fracture. Assessment:   1. Ganglion cyst of right foot   2. Ankle joint loose body, right   3. Ankle arthritis   4. Synovitis of right ankle      Plan:  Patient was evaluated and treated and all questions answered.  # Ankle joint loose body and arthritis of the right side, likely ankle synovitis #Soft tissue mass of right foot possible ganglion cyst -Discussed with the patient that based on the x-rays as well as his clinical findings it appears he has significant arthritic changes in the lateral aspect of the right tibiotalar joint -I do not believe the soft tissue mass which was palpable is causing the majority of his pain versus the ankle arthritic changes and possible osteochondral lesion of the right talus -I discussed that conservatively could proceed with steroid injection however given the loose bodies and anterior osteophyte formation of the distal anterior tibia steroid injection would not be fully effective. -In regards to the cyst this could be aspirated or drained however again I do not feel that this will fully resolve his pain -Surgically plan would be for ankle arthroscopy with loose body resection and osteophyte resection of the distal anterior tibia possibly with an ankle arthrotomy as needed -Additionally I would attempt to remove the soft tissue mass present at  the dorsal lateral aspect of the ankle with excision -Also discussed that though we can try with ankle arthroscopy and removal of ankle osteophyte it may be unsuccessful in the long-term he may ultimately need additional surgical procedures including but not limited to ankle replacement or ankle joint arthrodesis for pain relief .-Discussed risk benefits alternatives possible complications associate with these  procedures.  Also discussed the expected postop recovery course with the patient.  All questions were answered informed consent was obtained.  Patient wishes to proceed we will begin surgical planning  Return for after OR.          Everitt Amber, DPM [facility]

## 2022-03-15 ENCOUNTER — Ambulatory Visit (AMBULATORY_SURGERY_CENTER): Payer: Medicaid Other

## 2022-03-15 VITALS — Ht 70.0 in | Wt 271.0 lb

## 2022-03-15 DIAGNOSIS — Z1211 Encounter for screening for malignant neoplasm of colon: Secondary | ICD-10-CM

## 2022-03-15 MED ORDER — PEG 3350-KCL-NA BICARB-NACL 420 G PO SOLR: 4000.0000 mL | Freq: Once | ORAL | 0 refills | Status: AC

## 2022-03-15 NOTE — Progress Notes (Signed)
No egg or soy allergy known to patient  No issues known to pt with past sedation with any surgeries or procedures Patient denies ever being told they had issues or difficulty with intubation  No FH of Malignant Hyperthermia Pt is not on diet pills Pt is not on  home 02  Pt is not on blood thinners  Pt denies issues with constipation  No A fib or A flutter Have any cardiac testing pending--no Pt instructed to use Single[employer][email or web address] or GoodRx for a price reduction on prep   

## 2022-03-18 ENCOUNTER — Encounter: Payer: Self-pay | Admitting: Family Medicine

## 2022-03-18 ENCOUNTER — Ambulatory Visit (INDEPENDENT_AMBULATORY_CARE_PROVIDER_SITE_OTHER): Payer: Medicaid Other | Admitting: Family Medicine

## 2022-03-18 VITALS — BP 166/97 | HR 74 | Ht 70.0 in | Wt 275.2 lb

## 2022-03-18 DIAGNOSIS — G8929 Other chronic pain: Secondary | ICD-10-CM

## 2022-03-18 DIAGNOSIS — T7840XD Allergy, unspecified, subsequent encounter: Secondary | ICD-10-CM | POA: Diagnosis not present

## 2022-03-18 DIAGNOSIS — L989 Disorder of the skin and subcutaneous tissue, unspecified: Secondary | ICD-10-CM | POA: Diagnosis not present

## 2022-03-18 DIAGNOSIS — M545 Low back pain, unspecified: Secondary | ICD-10-CM | POA: Diagnosis not present

## 2022-03-18 DIAGNOSIS — M6740 Ganglion, unspecified site: Secondary | ICD-10-CM

## 2022-03-18 DIAGNOSIS — R7989 Other specified abnormal findings of blood chemistry: Secondary | ICD-10-CM

## 2022-03-18 DIAGNOSIS — I10 Essential (primary) hypertension: Secondary | ICD-10-CM

## 2022-03-18 DIAGNOSIS — B360 Pityriasis versicolor: Secondary | ICD-10-CM

## 2022-03-18 HISTORY — DX: Disorder of the skin and subcutaneous tissue, unspecified: L98.9

## 2022-03-18 MED ORDER — LOSARTAN POTASSIUM-HCTZ 50-12.5 MG PO TABS: 1.0000 | ORAL_TABLET | Freq: Every day | ORAL | 0 refills | Status: DC

## 2022-03-18 NOTE — Assessment & Plan Note (Addendum)
Uncontrolled on amlodipine 2.5 mg daily. Will switch to losartan-HCTZ 50-12.5 mg daily. Will check BMP in 1 week. Advised patient to schedule for ambulatory blood pressure monitoring with Dr. Valentina Lucks.

## 2022-03-18 NOTE — Progress Notes (Signed)
    SUBJECTIVE:   CHIEF COMPLAINT / HPI:   HTN Has been taking his amlodipine 2.5 mg daily. Has stopped using bountiful beet supplements, and he feels this could be causing his BP to be higher.  Chronic lower back pain Massages have been helping. Has not had to use voltaren. Has been taking tylenol every 3 or 4 days which is down from his frequent use of Goody powders.  Ankle pain Has surgery on 3/27 for likely ganglion cyst and OA of the R ankle. Tylenol has been helping. He has also been using compression stockings.  L neck lesion Been present for 4-5 years. Feels like there is a hair in there but he has not been able to express any hair. Has messed with it before and sometimes sees blood and pus. It has not gone away or grown in size. No painful. Has placed some cream on it as well as a hot rag to try and get a hair out without minimal relief.  Allergies Claritin has been helping to control his sinus pressure and sneezing. He still feels more congested in the mornings. Once he takes the medicine in the morning, it typically helps.  OBJECTIVE:   BP (!) 166/97   Pulse 74   Ht 5\' 10"  (1.778 m)   Wt 275 lb 4 oz (124.9 kg)   SpO2 99%   BMI 39.49 kg/m   General: Alert and oriented, in NAD Skin: Warm, dry; 2-3 cm raised, pink, pearly lesion along L jaw line with underlying hyperpigmentation; no pain to palpation, no discharge, no bleeding; multiple small hairs exiting from periphery HEENT: NCAT, EOM grossly normal, midline nasal septum Cardiac: RRR, no m/r/g appreciated Respiratory: Breathing and speaking comfortably on RA Extremities: Moves all extremities grossly equally Neurological: No gross focal deficit Psychiatric: Appropriate mood and affect     ASSESSMENT/PLAN:   Tinea versicolor Appears resolved with diflucan. Follow up as needed.  Allergies Improved with claritin. Continue management.  Ganglion cyst Scheduled for surgery for cyst + OA joint cleanout with DPM.  Follow up after surgery.  Elevated serum creatinine Will recheck at subsequent visit with better BP control.  Skin lesion Appearance and location concerning for basal cell carcinoma. Also consider sebaceous cyst and hypertrophic scar. Do not suspect this is ingrown hair given chronicity and size. Given location of lesion on neck, will refer to dermatology for further evaluation.  Hypertension Uncontrolled on amlodipine 2.5 mg daily. Will switch to losartan-HCTZ 50-12.5 mg daily. Will check BMP in 1 week. Advised patient to schedule for ambulatory blood pressure monitoring with Dr. Valentina Lucks.  Ethelene Hal, [place]

## 2022-03-18 NOTE — Assessment & Plan Note (Signed)
Will recheck at subsequent visit with better BP control.

## 2022-03-18 NOTE — Patient Instructions (Addendum)
It was great to see you today! Here's what we talked about:  Your blood pressure remains high. I have sent in another medicine, losartan-HCTZ, to use daily INSTEAD of the amlodipine. You will need to come back in 1 week to recheck your labs. You should also complete the blood pressure log below and make an appointment with Dr. Valentina Lucks to measure your blood pressure well at home. I have referred you to dermatology to evaluate the area on your neck. Follow up with me after your surgery to see how it is going.  Please let me know if you have any other questions.  Dr. Marcha Dutton

## 2022-03-18 NOTE — Assessment & Plan Note (Signed)
Scheduled for surgery for cyst + OA joint cleanout with DPM. Follow up after surgery.

## 2022-03-18 NOTE — Assessment & Plan Note (Signed)
Appears resolved with diflucan. Follow up as needed.

## 2022-03-18 NOTE — Assessment & Plan Note (Signed)
Improved with claritin. Continue management.

## 2022-03-18 NOTE — Assessment & Plan Note (Signed)
Appearance and location concerning for basal cell carcinoma. Also consider sebaceous cyst and hypertrophic scar. Do not suspect this is ingrown hair given chronicity and size. Given location of lesion on neck, will refer to dermatology for further evaluation.

## 2022-03-22 ENCOUNTER — Other Ambulatory Visit: Payer: Medicaid Other

## 2022-03-22 DIAGNOSIS — I10 Essential (primary) hypertension: Secondary | ICD-10-CM

## 2022-03-23 LAB — BASIC METABOLIC PANEL
BUN/Creatinine Ratio: 8 — ABNORMAL LOW (ref 9–20)
BUN: 10 mg/dL (ref 6–24)
CO2: 22 mmol/L (ref 20–29)
Calcium: 9.7 mg/dL (ref 8.7–10.2)
Chloride: 101 mmol/L (ref 96–106)
Creatinine, Ser: 1.25 mg/dL (ref 0.76–1.27)
Glucose: 115 mg/dL — ABNORMAL HIGH (ref 70–99)
Potassium: 4.2 mmol/L (ref 3.5–5.2)
Sodium: 140 mmol/L (ref 134–144)
eGFR: 68 mL/min/{1.73_m2} (ref >59)

## 2022-03-29 ENCOUNTER — Telehealth: Payer: Self-pay | Admitting: Urology

## 2022-03-29 ENCOUNTER — Other Ambulatory Visit: Payer: Self-pay | Admitting: Podiatry

## 2022-03-29 DIAGNOSIS — M19079 Primary osteoarthritis, unspecified ankle and foot: Secondary | ICD-10-CM

## 2022-03-29 DIAGNOSIS — Z9889 Other specified postprocedural states: Secondary | ICD-10-CM

## 2022-03-29 DIAGNOSIS — M659 Synovitis and tenosynovitis, unspecified: Secondary | ICD-10-CM

## 2022-03-29 DIAGNOSIS — M67471 Ganglion, right ankle and foot: Secondary | ICD-10-CM

## 2022-03-29 DIAGNOSIS — M24071 Loose body in right ankle: Secondary | ICD-10-CM

## 2022-03-29 MED ORDER — OXYCODONE-ACETAMINOPHEN 5-325 MG PO TABS: 1.0000 | ORAL_TABLET | ORAL | 0 refills | Status: AC | PRN

## 2022-03-29 MED ORDER — IBUPROFEN 800 MG PO TABS: 800.0000 mg | ORAL_TABLET | Freq: Three times a day (TID) | ORAL | 0 refills | Status: DC | PRN

## 2022-03-29 MED ORDER — CEPHALEXIN 500 MG PO CAPS: 500.0000 mg | ORAL_CAPSULE | Freq: Three times a day (TID) | ORAL | 0 refills | Status: AC

## 2022-03-29 NOTE — Progress Notes (Signed)
Postop medications sent 

## 2022-03-29 NOTE — Telephone Encounter (Signed)
DOS - 03/31/22  EXC TUMOR RIGHT --- [id number] ANKLE ARTHROSCOPY WITH DEBRIDEMENT RIGHT --- [id number]  [facility] EFFECTIVE DATE - 02/04/22  DR. STANDIFORD HAD A PEER TO PEER WITH DR. Blawenburg. I SPOKE WITH ALICA WITH [facility] AND SHE STATED THAT FOR CPT CODES 09811 AND 91478 HAVE BEEN APPROVED, AUTH # [id number], GOOD FROM 03/31/22 - 06/29/22.

## 2022-03-31 ENCOUNTER — Encounter: Payer: Self-pay | Admitting: Podiatry

## 2022-03-31 DIAGNOSIS — G8918 Other acute postprocedural pain: Secondary | ICD-10-CM | POA: Diagnosis not present

## 2022-03-31 DIAGNOSIS — M65871 Other synovitis and tenosynovitis, right ankle and foot: Secondary | ICD-10-CM | POA: Diagnosis not present

## 2022-03-31 DIAGNOSIS — M24071 Loose body in right ankle: Secondary | ICD-10-CM | POA: Diagnosis not present

## 2022-03-31 DIAGNOSIS — M13871 Other specified arthritis, right ankle and foot: Secondary | ICD-10-CM | POA: Diagnosis not present

## 2022-03-31 DIAGNOSIS — M799 Soft tissue disorder, unspecified: Secondary | ICD-10-CM | POA: Diagnosis not present

## 2022-03-31 DIAGNOSIS — M85871 Other specified disorders of bone density and structure, right ankle and foot: Secondary | ICD-10-CM | POA: Diagnosis not present

## 2022-03-31 DIAGNOSIS — M25871 Other specified joint disorders, right ankle and foot: Secondary | ICD-10-CM | POA: Diagnosis not present

## 2022-03-31 DIAGNOSIS — M67471 Ganglion, right ankle and foot: Secondary | ICD-10-CM | POA: Diagnosis not present

## 2022-04-05 ENCOUNTER — Ambulatory Visit (INDEPENDENT_AMBULATORY_CARE_PROVIDER_SITE_OTHER): Payer: Medicaid Other | Admitting: Pharmacist

## 2022-04-05 ENCOUNTER — Encounter: Payer: Self-pay | Admitting: Pharmacist

## 2022-04-05 ENCOUNTER — Encounter: Payer: Medicaid Other | Admitting: Family Medicine

## 2022-04-05 VITALS — BP 135/89 | HR 83 | Wt 284.8 lb

## 2022-04-05 DIAGNOSIS — I10 Essential (primary) hypertension: Secondary | ICD-10-CM

## 2022-04-05 MED ORDER — LOSARTAN POTASSIUM-HCTZ 50-12.5 MG PO TABS: 1.0000 | ORAL_TABLET | Freq: Every day | ORAL | 0 refills | Status: DC

## 2022-04-05 MED ORDER — AMLODIPINE BESYLATE 2.5 MG PO TABS
2.5000 mg | ORAL_TABLET | Freq: Every day | ORAL | 3 refills | Status: DC
Start: 2022-04-05 — End: 2022-05-18

## 2022-04-05 NOTE — Progress Notes (Signed)
S:     Chief Complaint  Patient presents with   Medication Management    Hypertension   52 y.o. male who presents for hypertension evaluation, education, and management.  PMH is significant for elevated blood pressures.  Patient was referred and last seen by Primary Care Provider, Dr. Marcha Dutton, on 03/18/22.   At last visit, Amlodipine 2.5 mg daily was discontinued and replaced with Losartan-HCTZ 20-12.5 mg daily to control BP.   Today, patient arrives in good spirits and uses crutches to help walk around due to recent foot surgery (we had planned Amb BP monitoring assessment today and tomorrow however we deferred to a later date). Denies dizziness, headache, blurred vision, swelling.   Patient reports hypertension was diagnosed in 2016.   Family/Social history: Patient reports mother and sister has HTN  Patient is adherent to medications. Patient has taken BP medications today.   Current antihypertensives include: Losartan-HCTZ 50-12.5 mg  Antihypertensives tried in the past include: Amlodipine 2.5 mg  Reported home BP readings: Patient has home wrist cuff but states the numbers are all over the place.  Patient-reported exercise habits: Patient is currently unable to get around much due to cast around foot from surgery.   O:  Review of Systems  Musculoskeletal:  Positive for joint pain (recent foot surgery).  Neurological:  Negative for dizziness.  All other systems reviewed and are negative.   Physical Exam Vitals reviewed.  Constitutional:      Appearance: Normal appearance.  Pulmonary:     Effort: Pulmonary effort is normal.  Musculoskeletal:     Comments: Cast on right leg and foot  Neurological:     Mental Status: He is alert.  Psychiatric:        Mood and Affect: Mood normal.        Behavior: Behavior normal.        Thought Content: Thought content normal.        Judgment: Judgment normal.     Last 3 Office BP readings: BP Readings from Last 3 Encounters:   04/05/22 135/89  03/18/22 (!) 166/97  03/03/22 (!) 150/90    BMET    Component Value Date/Time   NA 140 03/22/2022 1140   K 4.2 03/22/2022 1140   CL 101 03/22/2022 1140   CO2 22 03/22/2022 1140   GLUCOSE 115 (H) 03/22/2022 1140   BUN 10 03/22/2022 1140   CREATININE 1.25 03/22/2022 1140   CALCIUM 9.7 03/22/2022 1140    Clinical ASCVD: No  The 10-year ASCVD risk score (Arnett DK, et al., 2019) is: 11%   Values used to calculate the score:     Age: 16 years     Sex: Male     Is Non-Hispanic African American: Yes     Diabetic: No     Tobacco smoker: No     Systolic Blood Pressure: [id number] mmHg     Is BP treated: Yes     HDL Cholesterol: 52 mg/dL     Total Cholesterol: 176 mg/dL  Patient is participating in a Managed Medicaid Plan:  Yes    A/P: Hypertension diagnosed in 2016 currently on 2 medications (losartan-HCTZ). BP goal < 130/80 mmHg. Medication adherence appears good. Control is suboptimal due to elevated BP readings at home and BP in office not under goal.  -Restarted Amlodipine 2.5 mg daily. Patient educated on purpose, proper use, and potential adverse effects of amlodipine (edema in legs). -Continued Losartan-HCTZ 50-12.5 mg daily.  -F/u labs ordered - UACR and  BMET was ordered today -Counseled on lifestyle modifications for blood pressure control including reduced dietary sodium, increased exercise, adequate sleep. -Encouraged patient to check BP at home and bring log of readings to next visit. Counseled on proper use of home BP cuff.   Results reviewed and written information provided.    Written patient instructions provided. Patient verbalized understanding of treatment plan.  Total time in face to face counseling 25 minutes.    Follow-up:  Pharmacist 04/26/22.  Patient seen with Gena Fray PharmD PGY-1 Pharmacy Resident and Estelle June, PharmD Candidate.

## 2022-04-05 NOTE — Assessment & Plan Note (Signed)
Hypertension diagnosed in 2016 currently on 2 medications (losartan-HCTZ). BP goal < 130/80 mmHg. Medication adherence appears good. Control is suboptimal due to elevated BP readings at home and BP in office not under goal.  -Restarted Amlodipine 2.5 mg daily. Patient educated on purpose, proper use, and potential adverse effects of amlodipine (edema in legs). -Continued Losartan-HCTZ 50-12.5 mg daily.  -F/u labs ordered - UACR and BMET was ordered today -Counseled on lifestyle modifications for blood pressure control including reduced dietary sodium, increased exercise, adequate sleep. -Encouraged patient to check BP at home and bring log of readings to next visit. Counseled on proper use of home BP cuff.

## 2022-04-05 NOTE — Progress Notes (Signed)
Error - appointment scheduled in error

## 2022-04-05 NOTE — Patient Instructions (Addendum)
It was great seeing you today!  Here are the medication changes we made today:  RESTART amlodipine 2.5 mg daily

## 2022-04-06 ENCOUNTER — Ambulatory Visit (INDEPENDENT_AMBULATORY_CARE_PROVIDER_SITE_OTHER): Payer: Medicaid Other | Admitting: Podiatry

## 2022-04-06 ENCOUNTER — Ambulatory Visit (INDEPENDENT_AMBULATORY_CARE_PROVIDER_SITE_OTHER): Payer: Medicaid Other

## 2022-04-06 DIAGNOSIS — M67471 Ganglion, right ankle and foot: Secondary | ICD-10-CM

## 2022-04-06 DIAGNOSIS — M659 Synovitis and tenosynovitis, unspecified: Secondary | ICD-10-CM | POA: Diagnosis not present

## 2022-04-06 DIAGNOSIS — Z9889 Other specified postprocedural states: Secondary | ICD-10-CM

## 2022-04-06 DIAGNOSIS — M65971 Unspecified synovitis and tenosynovitis, right ankle and foot: Secondary | ICD-10-CM

## 2022-04-06 DIAGNOSIS — M19071 Primary osteoarthritis, right ankle and foot: Secondary | ICD-10-CM

## 2022-04-06 DIAGNOSIS — M24071 Loose body in right ankle: Secondary | ICD-10-CM

## 2022-04-06 MED ORDER — OXYCODONE-ACETAMINOPHEN 5-325 MG PO TABS: 1.0000 | ORAL_TABLET | ORAL | 0 refills | Status: AC | PRN

## 2022-04-06 NOTE — Progress Notes (Signed)
Subjective:  Patient ID: Jose Mcgee, male    DOB: 10/04/1970,  MRN: [id number]  Chief Complaint  Patient presents with   Routine Post Op    POV # 1 DOS - 03/31/22 --- RIGHT FOOT REMOVAL OF SOFT TISSUE MASS, RIGHT ANKLE ARTHROSCOPY WITH TIBIAL EXOSTECTOMY, POSS ANKLE ARTHROTOMY, patient had a fall this morning, swelling on the lateral side of the ankle, patient denies any N/V/F/C/SOB, rate of pain 6 out  of 10, X-rays done today     DOS: 03/31/2022 Procedure: Right ankle arthroscopy with synovectomy and extensive debridement and removal of multiple loose bodies.  Right ankle soft tissue mass excision.  52 y.o. male returns for post-op check.  Patient is approximately 1 week status post right ankle arthroscopy with loose body excision and extensive debridement as well as soft tissue mass excision.  He is doing pretty well.  He did have a fall today this morning where he had the right ankle and has noticed some swelling since then.  He is kept the dressing clean dry and intact since the operative room.  He says he does not have much pain in the right ankle he has been taking Percocet and ibuprofen for pain.  Has been nonweightbearing with use of crutches on the right side.  Review of Systems: Negative except as noted in the HPI. Denies N/V/F/Ch.   Objective:  There were no vitals filed for this visit. There is no height or weight on file to calculate BMI. Constitutional Well developed. Well nourished.  Vascular Foot warm and well perfused. Capillary refill normal to all digits.  Calf is soft and supple, no posterior calf or knee pain, negative Homans' sign  Neurologic Normal speech. Oriented to person, place, and time. Epicritic sensation to light touch grossly present bilaterally.  Dermatologic Skin healing well without signs of infection. Skin edges well coapted without signs of infection.  No erythema or drainage  Orthopedic: Minimal tenderness to palpation noted about the surgical  site.  There is significant edema of the right ankle.  Unclear if this is postsurgical or related to the fall he sustained earlier today   Multiple view plain film radiographs: April 06, 2022.  X-ray AP lateral oblique right ankle nonweightbearing.  Findings: Attention directed to the tibiotalar joint on the lateral view there is noted to be decreased osseous spurring at the anterior lip of the tibia however there is still is a spur there however slightly decreased in prominence.  There is decreased osseous fragmentation noted in the anterior tibiotalar joint.  No fractures identified of the tibia or fibula on these views.  There is still significant arthritic changes at the medial and lateral gutter and ankle joint noted and synostosis of the syndesmosis. Assessment:   1. Post-operative state   2. Ganglion cyst of right foot   3. Ankle joint loose body, right   4. Arthritis of right ankle   5. Synovitis of right ankle    Plan:  Patient was evaluated and treated and all questions answered.  S/p foot surgery right ankle arthroscopy with debridement and soft tissue mass excision -Progressing as expected post-operatively.  Patient had a fall this morning but does not appear to have done any damage to the surgical site. -Overall he is progressing well he does not have significant pain he does have a lot of swelling however -XR: As above no acute postoperative complication -WB Status: Nonweightbearing in posterior splint which was reapplied at this visit -Sutures: To remain intact until next  visit. -Medications: Ibuprofen and Percocet as needed refill of Percocet sent recommend the patient take ibuprofen 600 to 800 mg 3 times a day  Return in about 1 week (around 04/13/2022) for Second postop visit right ankle.         Everitt Amber, DPM [facility]

## 2022-04-06 NOTE — Progress Notes (Signed)
Reviewed and agree with Dr Koval's plan.   

## 2022-04-07 LAB — BASIC METABOLIC PANEL
BUN/Creatinine Ratio: 9 (ref 9–20)
BUN: 11 mg/dL (ref 6–24)
CO2: 22 mmol/L (ref 20–29)
Calcium: 9.6 mg/dL (ref 8.7–10.2)
Chloride: 101 mmol/L (ref 96–106)
Creatinine, Ser: 1.29 mg/dL — ABNORMAL HIGH (ref 0.76–1.27)
Glucose: 132 mg/dL — ABNORMAL HIGH (ref 70–99)
Potassium: 4.6 mmol/L (ref 3.5–5.2)
Sodium: 141 mmol/L (ref 134–144)
eGFR: 66 mL/min/{1.73_m2} (ref >59)

## 2022-04-07 LAB — MICROALBUMIN / CREATININE URINE RATIO
Creatinine, Urine: 287.7 mg/dL
Microalb/Creat Ratio: 18 mg/g creat (ref 0–29)
Microalbumin, Urine: 51.1 ug/mL

## 2022-04-09 ENCOUNTER — Ambulatory Visit: Payer: Medicaid Other | Admitting: Family Medicine

## 2022-04-13 ENCOUNTER — Ambulatory Visit (INDEPENDENT_AMBULATORY_CARE_PROVIDER_SITE_OTHER): Payer: Medicaid Other | Admitting: Podiatry

## 2022-04-13 DIAGNOSIS — M67471 Ganglion, right ankle and foot: Secondary | ICD-10-CM

## 2022-04-13 DIAGNOSIS — M24071 Loose body in right ankle: Secondary | ICD-10-CM

## 2022-04-13 DIAGNOSIS — M19071 Primary osteoarthritis, right ankle and foot: Secondary | ICD-10-CM

## 2022-04-13 DIAGNOSIS — Z9889 Other specified postprocedural states: Secondary | ICD-10-CM

## 2022-04-13 DIAGNOSIS — M659 Synovitis and tenosynovitis, unspecified: Secondary | ICD-10-CM

## 2022-04-13 MED ORDER — OXYCODONE-ACETAMINOPHEN 5-325 MG PO TABS: 1.0000 | ORAL_TABLET | ORAL | 0 refills | Status: AC | PRN

## 2022-04-13 MED ORDER — IBUPROFEN 600 MG PO TABS: 600.0000 mg | ORAL_TABLET | Freq: Three times a day (TID) | ORAL | 0 refills | Status: DC | PRN

## 2022-04-13 NOTE — Progress Notes (Signed)
  Subjective:  Patient ID: Jose Mcgee, male    DOB: [birth year],  MRN: [id number]  Chief Complaint  Patient presents with   Post-op Follow-up    RIGHT FOOT REMOVAL OF SOFT TISSUE MASS, RIGHT ANKLE ARTHROSCOPY WITH TIBIAL EXOSTECTOMY, POSS ANKLE ARTHROTOMY,                                                  RIGHT FOOT REMOVAL OF SOFT TISSUE MASS, RIGHT ANKLE ARTHROSCOPY WITH TIBIAL EXOSTECTOMY, POSS ANKLE ARTHROTOMY, PATIENT STATED HE IS DOING WELL, MINIMAL PAIN, TAKING IBUPROFEN AND OXYCODONE TO HELP AIDE THE PAIN                 DOS: 03/31/2022 Procedure: Right ankle arthroscopy with synovectomy and extensive debridement and removal of multiple loose bodies.  Right ankle soft tissue mass excision.  52 y.o. male returns for second post-op check.  Patient is approximately 2 week status post right ankle arthroscopy with loose body excision and extensive debridement as well as soft tissue mass excision.  He states that his pain is decreasing now about a 4 out of 10.    He is kept the dressing clean dry and intact since last visit.  Still taking ibuprofen and Percocet for pain.  He has been nonweightbearing in a posterior splint with crutch assist  Review of Systems: Negative except as noted in the HPI. Denies N/V/F/Ch.   Objective:  There were no vitals filed for this visit. There is no height or weight on file to calculate BMI. Constitutional Well developed. Well nourished.  Vascular Foot warm and well perfused. Capillary refill normal to all digits.  Calf is soft and supple, no posterior calf or knee pain, negative Homans' sign  Neurologic Normal speech. Oriented to person, place, and time. Epicritic sensation to light touch grossly present bilaterally.  Dermatologic Skin healing well without signs of infection. Skin edges well coapted without signs of infection.  No erythema or drainage  Orthopedic: Minimal tenderness to  palpation noted about the surgical site.  There is slightly decreased edema of the right ankle.  Patient does have active plantarflexion dorsiflexion range of motion though limited due to swelling and pain   Multiple view plain film radiographs: Deferred at this visit Assessment:   1. Post-operative state   2. Ganglion cyst of right foot   3. Ankle joint loose body, right   4. Arthritis of right ankle   5. Synovitis of right ankle     Plan:  Patient was evaluated and treated and all questions answered.  S/p foot surgery right ankle arthroscopy with debridement and soft tissue mass excision -Progressing as expected post-operatively.  Patient peers to be improving with slightly decreased edema and decreasing pain overall -XR: Deferred at this visit -WB Status: Nonweightbearing in cam boot with was dispensed the patient at this visit.  1 patient to remain nonweightbearing for approximately 1 more week and then begin transitioning slowly back to weightbearing. -The patient to begin range of motion exercises nonweightbearing dorsiflexion and plantarflexion to max limit -Sutures: Removed in total at this -Medications: Ibuprofen and Percocet as needed refill of Percocet sent recommend the patient take ibuprofen 600 to 800 mg 3 times a day  No follow-ups on file.         Corinna Gab, DPM [facility]

## 2022-04-23 ENCOUNTER — Ambulatory Visit (AMBULATORY_SURGERY_CENTER): Payer: Medicaid Other | Admitting: Internal Medicine

## 2022-04-23 ENCOUNTER — Encounter: Payer: Self-pay | Admitting: Internal Medicine

## 2022-04-23 VITALS — BP 126/86 | HR 74 | Temp 96.9°F | Resp 16 | Ht 70.0 in | Wt 271.0 lb

## 2022-04-23 DIAGNOSIS — M129 Arthropathy, unspecified: Secondary | ICD-10-CM | POA: Diagnosis not present

## 2022-04-23 DIAGNOSIS — I10 Essential (primary) hypertension: Secondary | ICD-10-CM | POA: Diagnosis not present

## 2022-04-23 DIAGNOSIS — Z1211 Encounter for screening for malignant neoplasm of colon: Secondary | ICD-10-CM | POA: Diagnosis not present

## 2022-04-23 MED ORDER — SODIUM CHLORIDE 0.9 % IV SOLN
500.0000 mL | Freq: Once | INTRAVENOUS | Status: DC
Start: 2022-04-23 — End: 2022-04-23

## 2022-04-23 NOTE — Progress Notes (Signed)
HISTORY OF PRESENT ILLNESS:  Jose Mcgee is a 52 y.o. male who presents for routine screening colonoscopy.  No complaints  REVIEW OF SYSTEMS:  All non-GI ROS negative except for  Past Medical History:  Diagnosis Date   Allergy    Arthritis    Hypertension     Past Surgical History:  Procedure Laterality Date   HERNIA REPAIR Left 1971   rt ankle surgery Right     Social History Jose Mcgee  reports that he has never smoked. He has never been exposed to tobacco smoke. He has never used smokeless tobacco. He reports current alcohol use. He reports that he does not currently use drugs after having used the following drugs: Marijuana.  family history includes Diabetes in his mother and another family member; Heart disease in his mother and another family member; Hypertension in his mother and other family members; Stroke in his mother and another family member.  No Known Allergies     PHYSICAL EXAMINATION: Vital signs: BP 135/88   Pulse 73   Temp (!) 96.9 F (36.1 C)   Ht  (1.778 m)   Wt 271 lb (122.9 kg)   SpO2 99%   BMI 38.88 kg/m  General: Well-developed, well-nourished, no acute distress HEENT: Sclerae are anicteric, conjunctiva pink. Oral mucosa intact Lungs: Clear Heart: Regular Abdomen: soft, nontender, nondistended, no obvious ascites, no peritoneal signs, normal bowel sounds. No organomegaly. Extremities: No edema Psychiatric: alert and oriented x3. Cooperative     ASSESSMENT:   Colon cancer screening  PLAN:  Screening colonoscopy

## 2022-04-23 NOTE — Progress Notes (Signed)
A and O x3. Report to RN. Tolerated MAC anesthesia well. 

## 2022-04-23 NOTE — Op Note (Signed)
[facility] Patient Name: Jose Mcgee Procedure Date: 04/23/2022 11:30 AM MRN: [id number] Endoscopist: Wilhemina Bonito. Marina Goodell , MD, [id number] Age: 52 Referring MD:  Date of Birth: 26-Jan-1970 Gender: Male Account #: [id number] Procedure:                Colonoscopy Indications:              Screening for colorectal malignant neoplasm Medicines:                Monitored Anesthesia Care Procedure:                Pre-Anesthesia Assessment:                           - Prior to the procedure, a History and Physical                            was performed, and patient medications and                            allergies were reviewed. The patient's tolerance of                            previous anesthesia was also reviewed. The risks                            and benefits of the procedure and the sedation                            options and risks were discussed with the patient.                            All questions were answered, and informed consent                            was obtained. Prior Anticoagulants: The patient has                            taken no anticoagulant or antiplatelet agents.                            After reviewing the risks and benefits, the patient                            was deemed in satisfactory condition to undergo the                            procedure.                           After obtaining informed consent, the colonoscope                            was passed under direct vision. Throughout the  procedure, the patient's blood pressure, pulse, and                            oxygen saturations were monitored continuously. The                            CF [id number] #[id number] was introduced through the anus                            and advanced to the the cecum, identified by                            appendiceal orifice and ileocecal valve. The                            ileocecal valve, appendiceal orifice,  and rectum                            were photographed. The quality of the bowel                            preparation was good. The colonoscopy was performed                            without difficulty. The patient tolerated the                            procedure well. The bowel preparation used was                            SUPREP via split dose instruction. Scope In: 11:48:13 AM Scope Out: 11:59:25 AM Scope Withdrawal Time: 0 hours 9 minutes 37 seconds  Total Procedure Duration: 0 hours 11 minutes 12 seconds  Findings:                 The entire examined colon appeared normal . Complications:            No immediate complications. Estimated blood loss:                            None. Estimated Blood Loss:     Estimated blood loss: none. Impression:               - The entire examined colon is normal.                           - No specimens collected. Recommendation:           - Repeat colonoscopy in 10 years for screening                            purposes.                           - Patient has a contact number available for  emergencies. The signs and symptoms of potential                            delayed complications were discussed with the                            patient. Return to normal activities tomorrow.                            Written discharge instructions were provided to the                            patient.                           - Resume previous diet.                           - Continue present medications. Wilhemina Bonito. Marina Goodell, MD 04/23/2022 12:05:36 PM This report has been signed electronically.

## 2022-04-23 NOTE — Progress Notes (Signed)
Pt's states no medical or surgical changes since previsit or office visit. 

## 2022-04-23 NOTE — Patient Instructions (Signed)
Impression/Recommendations:  Resume previous diet. Continue present medications. Repeat colonoscopy in 10 years for screening purposes.  YOU HAD AN ENDOSCOPIC PROCEDURE TODAY AT THE [facility]:   Refer to the procedure report that was given to you for any specific questions about what was found during the examination.  If the procedure report does not answer your questions, please call your gastroenterologist to clarify.  If you requested that your care partner not be given the details of your procedure findings, then the procedure report has been included in a sealed envelope for you to review at your convenience later.  YOU SHOULD EXPECT: Some feelings of bloating in the abdomen. Passage of more gas than usual.  Walking can help get rid of the air that was put into your GI tract during the procedure and reduce the bloating. If you had a lower endoscopy (such as a colonoscopy or flexible sigmoidoscopy) you may notice spotting of blood in your stool or on the toilet paper. If you underwent a bowel prep for your procedure, you may not have a normal bowel movement for a few days.  Please Note:  You might notice some irritation and congestion in your nose or some drainage.  This is from the oxygen used during your procedure.  There is no need for concern and it should clear up in a day or so.  SYMPTOMS TO REPORT IMMEDIATELY:  Following lower endoscopy (colonoscopy or flexible sigmoidoscopy):  Excessive amounts of blood in the stool  Significant tenderness or worsening of abdominal pains  Swelling of the abdomen that is new, acute  Fever of 100F or higher  For urgent or emergent issues, a gastroenterologist can be reached at any hour by calling [phone]. Do not use MyChart messaging for urgent concerns.    DIET:  We do recommend a small meal at first, but then you may proceed to your regular diet.  Drink plenty of fluids but you should avoid alcoholic beverages for 24  hours.  ACTIVITY:  You should plan to take it easy for the rest of today and you should NOT DRIVE or use heavy machinery until tomorrow (because of the sedation medicines used during the test).    FOLLOW UP: Our staff will call the number listed on your records the next business day following your procedure.  We will call around 7:15- 8:00 am to check on you and address any questions or concerns that you may have regarding the information given to you following your procedure. If we do not reach you, we will leave a message.     If any biopsies were taken you will be contacted by phone or by letter within the next 1-3 weeks.  Please call us at [phone] if you have not heard about the biopsies in 3 weeks.    SIGNATURES/CONFIDENTIALITY: You and/or your care partner have signed paperwork which will be entered into your electronic medical record.  These signatures attest to the fact that that the information above on your After Visit Summary has been reviewed and is understood.  Full responsibility of the confidentiality of this discharge information lies with you and/or your care-partner.  

## 2022-04-26 ENCOUNTER — Telehealth: Payer: Self-pay | Admitting: *Deleted

## 2022-04-26 ENCOUNTER — Ambulatory Visit (INDEPENDENT_AMBULATORY_CARE_PROVIDER_SITE_OTHER): Payer: Medicaid Other | Admitting: Pharmacist

## 2022-04-26 VITALS — BP 140/102 | Wt 283.4 lb

## 2022-04-26 DIAGNOSIS — I10 Essential (primary) hypertension: Secondary | ICD-10-CM

## 2022-04-26 MED ORDER — LORATADINE 10 MG PO TABS: 10.0000 mg | ORAL_TABLET | Freq: Every day | ORAL | 11 refills | Status: DC

## 2022-04-26 NOTE — Telephone Encounter (Signed)
No answer on  follow up call. Left message.   

## 2022-04-26 NOTE — Progress Notes (Signed)
   S:     Chief Complaint  Patient presents with   Medication Management    AMB BP Monitoring Day 1   52 y.o. male who presents for hypertension evaluation, education, and management.  PMH is significant for HTN.  Patient was referred and last seen by Primary Care Provider, Dr. Phineas Real, on 03/18/22.  At last visit, amlodipine 2.5 mg daily was restarted, UACR and BMET was conducted, and ambulatory BP monitoring was postponed due to recent foot surgery.  Diagnosed with Hypertension in the year of 2016.    Medication compliance is reported to be adherent.  Discussed procedure for wearing the monitor and gave patient written instructions. Monitor was placed on non-dominant arm with instructions to return in the morning.   Current BP Medications include:  Amlodpine 2.5 mg daily, Losartan-HCTZ 50-12.5 mg daily  Antihypertensives tried in the past include: None  O:  Review of Systems  Musculoskeletal:  Positive for joint pain (recent foot surgery).  All other systems reviewed and are negative.   Physical Exam Constitutional:      Appearance: Normal appearance.  Neurological:     Mental Status: He is alert.  Psychiatric:        Mood and Affect: Mood normal.        Behavior: Behavior normal.        Thought Content: Thought content normal.        Judgment: Judgment normal.     Last 3 Office BP readings: BP Readings from Last 3 Encounters:  04/23/22 126/86  04/05/22 135/89  03/18/22 (!) 166/97    Clinical Atherosclerotic Cardiovascular Disease (ASCVD): No  The 10-year ASCVD risk score (Arnett DK, et al., 2019) is: 9.7%   Values used to calculate the score:     Age: 64 years     Sex: Male     Is Non-Hispanic African American: Yes     Diabetic: No     Tobacco smoker: No     Systolic Blood Pressure: 126 mmHg     Is BP treated: Yes     HDL Cholesterol: 52 mg/dL     Total Cholesterol: 176 mg/dL  Basic Metabolic Panel    Component Value Date/Time   NA 141 04/05/2022 1219    K 4.6 04/05/2022 1219   CL 101 04/05/2022 1219   CO2 22 04/05/2022 1219   GLUCOSE 132 (H) 04/05/2022 1219   BUN 11 04/05/2022 1219   CREATININE 1.29 (H) 04/05/2022 1219   CALCIUM 9.6 04/05/2022 1219    UACR was 18 on 04/05/2022  ABPM Study Data: Arm Placement left arm  For Office Goal Goal BP of <140/90:  ABPM thresholds: Overall BP < 130/80, daytime BP <135/85 mmHg, sleeptime BP <120/70 mmHg   Patient is participating in a Managed Medicaid Plan:  Yes   A/P: History of hypertension longstanding currently on antihypertensive medications. Goal presssure of <130/80.     -Placed blood pressure cuff, provided education, patient instructed to wear cuff for 24 hours and return tomorrow to review results.  Written patient instructions provided. Patient verbalized understanding of treatment plan.  Total time in face to face counseling 20 minutes.    Follow-up:  Pharmacist 04/27/22 Patient seen with Jerry Caras, PharmD PGY-1 Pharmacy Resident and Revonda Standard, PharmD Candidate.

## 2022-04-26 NOTE — Patient Instructions (Signed)
Blood Pressure Activity Diary Time Lying down/ Sleeping Walking/ Exercise Stressed/ Angry Headache/ Pain Dizzy  9 AM       10 AM       11 AM       12 PM       1 PM       2 PM       Time Lying down/ Sleeping Walking/ Exercise Stressed/ Angry Headache/ Pain Dizzy  3 PM       4 PM        5 PM       6 PM       7 PM       8 PM       Time Lying down/ Sleeping Walking/ Exercise Stressed/ Angry Headache/ Pain Dizzy  9 PM       10 PM       11 PM       12 AM       1 AM       2 AM       3 AM       Time Lying down/ Sleeping Walking/ Exercise Stressed/ Angry Headache/ Pain Dizzy  4 AM       5 AM       6 AM       7 AM       8 AM       9 AM       10 AM        Time you woke up: _________                  Time you went to sleep:__________  Come back tomorrow at 8:30 to have the monitor removed Call the [facility] if you have any questions before then ([phone])  Wearing the Blood Pressure Monitor The cuff will inflate every 20 minutes during the day and every 30 minutes while you sleep. Your blood pressure readings will NOT display after cuff inflation Fill out the blood pressure-activity diary during the day, especially during activities that may affect your reading -- such as exercise, stress, walking, taking your blood pressure medications  Important things to know: Avoid taking the monitor off for the next 24 hours, unless it causes you discomfort or pain. Do NOT get the monitor wet and do NOT dry to clean the monitor with any cleaning products. Do NOT put the monitor on anyone else's arm. When the cuff inflates, avoid excess movement. Let the cuffed arm hang loosely, slightly away from the body. Avoid flexing the muscles or moving the hand/fingers. When you go to sleep, make sure that the hose is not kinked. Remember to fill out the blood pressure activity diary. If you experience severe pain or unusual pain (not associated with getting your blood pressure  checked), remove the monitor.  Troubleshooting:  Code  Troubleshooting   1  Check cuff position, tighten cuff   2, 3  Remain still during reading   4, 87  Check air hose connections and make sure cuff is tight   85, 89  Check hose connections and make tubing is not crimped   86  Push START/STOP to restart reading   88, 91  Retry by pushing START/STOP   90  Replace batteries. If problem persists, remove monitor and bring back to   clinic at follow up   97, 98, 99  Service required - Remove monitor and bring back to clinic at   follow up    

## 2022-04-26 NOTE — Progress Notes (Signed)
Reviewed and agree with Dr Koval's plan.   

## 2022-04-26 NOTE — Assessment & Plan Note (Signed)
History of hypertension longstanding currently on antihypertensive medications. Goal presssure of <130/80.     -Placed blood pressure cuff, provided education, patient instructed to wear cuff for 24 hours and return tomorrow to review results.

## 2022-04-27 ENCOUNTER — Ambulatory Visit (INDEPENDENT_AMBULATORY_CARE_PROVIDER_SITE_OTHER): Payer: Medicaid Other | Admitting: Pharmacist

## 2022-04-27 ENCOUNTER — Encounter: Payer: Self-pay | Admitting: Pharmacist

## 2022-04-27 VITALS — BP 125/81

## 2022-04-27 DIAGNOSIS — I10 Essential (primary) hypertension: Secondary | ICD-10-CM | POA: Diagnosis not present

## 2022-04-27 NOTE — Patient Instructions (Signed)
It was nice to see you today!  Your goal blood pressure is <130/80 mm Hg.  No Medication Changes Today!   Monitor blood pressure at home daily and keep a log (on your phone or piece of paper) to bring with you to your next visit. Write down date, time, blood pressure and pulse.  Keep up the good work with diet and exercise. Aim for a diet full of vegetables, fruit and lean meats (chicken, [place], fish). Try to limit salt intake by eating fresh or frozen vegetables (instead of canned), rinse canned vegetables prior to cooking and do not add any additional salt to meals.

## 2022-04-27 NOTE — Assessment & Plan Note (Signed)
History of hypertension longstanding currently taking medications; goal presssure of <130/80.  Found to have in goal blood pressure with 24-hour ambulatory blood pressure evaluation which demonstrates an average AWAKE blood pressure of 125/81 mmHg. Nocturnal dipping pattern is normal.   Medications: -Continued amlodipine 2.5 mg daily -Continued Losartan-HCTZ 50-12.5 mg daily

## 2022-04-27 NOTE — Progress Notes (Signed)
   S:     Chief Complaint  Patient presents with   Medication Management    AMB BP monitoring day 2   52 y.o. male who presents for hypertension evaluation, education, and management.  PMH is significant for HTN.  Patient returns to clinic with 24 hour blood pressure monitor and reports they were able to wear the Ambulatory Blood Pressure Cuff for the entire 24 evaluation period.   O:  Review of Systems  All other systems reviewed and are negative.   Physical Exam Constitutional:      Appearance: Normal appearance.  Neurological:     Mental Status: He is alert.     Gait: Gait abnormal (Limps due to recent foot surgery).  Psychiatric:        Mood and Affect: Mood normal.        Behavior: Behavior normal.        Thought Content: Thought content normal.        Judgment: Judgment normal.     Last 3 Office BP readings: BP Readings from Last 3 Encounters:  04/26/22 (!) 140/102  04/23/22 126/86  04/05/22 135/89    Clinical Atherosclerotic Cardiovascular Disease (ASCVD): No  The 10-year ASCVD risk score (Arnett DK, et al., 2019) is: 11.8%   Values used to calculate the score:     Age: 64 years     Sex: Male     Is Non-Hispanic African American: Yes     Diabetic: No     Tobacco smoker: No     Systolic Blood Pressure: 140 mmHg     Is BP treated: Yes     HDL Cholesterol: 52 mg/dL     Total Cholesterol: 176 mg/dL  Basic Metabolic Panel    Component Value Date/Time   NA 141 04/05/2022 1219   K 4.6 04/05/2022 1219   CL 101 04/05/2022 1219   CO2 22 04/05/2022 1219   GLUCOSE 132 (H) 04/05/2022 1219   BUN 11 04/05/2022 1219   CREATININE 1.29 (H) 04/05/2022 1219   CALCIUM 9.6 04/05/2022 1219     ABPM Study Data: Arm Placement left arm  Overall Mean 24hr BP:   120/76 mmHg  HR: 76  Daytime Mean BP:  125/81 mmHg  HR: 72  Nighttime Mean BP:  108/64 mmHg  HR: 67  Dipping Pattern: Yes.    Sys:   13.8%   Dia: 21.0%   [normal dipping ~10-20%]   For Office Goal Goal BP  of <130/80:  ABPM thresholds: Overall BP < 125/75, daytime BP <130/80 mmHg, sleeptime BP <110/65 mmHg    Patient is participating in a Managed Medicaid Plan:  Yes   A/P: History of hypertension longstanding currently taking medications; goal presssure of <130/80.  Found to have in goal blood pressure with 24-hour ambulatory blood pressure evaluation which demonstrates an average AWAKE blood pressure of 125/81 mmHg. Nocturnal dipping pattern is normal.   Medications: -Continued amlodipine 2.5 mg daily -Continued Losartan-HCTZ 50-12.5 mg daily Results reviewed and written information provided.    Written patient instructions provided. Patient verbalized understanding of treatment plan.  Total time in face to face counseling 15 minutes.    Follow-up:  PCP clinic visit in 05/17/22  Patient seen with Jerry Caras, PharmD PGY-1 Pharmacy Resident and Revonda Standard, PharmD Candidate.

## 2022-04-28 NOTE — Progress Notes (Signed)
Reviewed and agree with Dr Koval's plan.   

## 2022-04-30 ENCOUNTER — Ambulatory Visit (INDEPENDENT_AMBULATORY_CARE_PROVIDER_SITE_OTHER): Payer: Medicaid Other

## 2022-04-30 ENCOUNTER — Ambulatory Visit (INDEPENDENT_AMBULATORY_CARE_PROVIDER_SITE_OTHER): Payer: Medicaid Other | Admitting: Podiatry

## 2022-04-30 DIAGNOSIS — M19071 Primary osteoarthritis, right ankle and foot: Secondary | ICD-10-CM | POA: Diagnosis not present

## 2022-04-30 DIAGNOSIS — Z9889 Other specified postprocedural states: Secondary | ICD-10-CM

## 2022-04-30 DIAGNOSIS — M659 Synovitis and tenosynovitis, unspecified: Secondary | ICD-10-CM

## 2022-04-30 DIAGNOSIS — M24071 Loose body in right ankle: Secondary | ICD-10-CM

## 2022-04-30 MED ORDER — IBUPROFEN 800 MG PO TABS: 800.0000 mg | ORAL_TABLET | Freq: Three times a day (TID) | ORAL | 0 refills | Status: DC | PRN

## 2022-04-30 NOTE — Progress Notes (Signed)
  Subjective:  Patient ID: Jose Mcgee, male    DOB: July 01, 1970,  MRN: [id number]  Chief Complaint  Patient presents with   Post-op Follow-up    POV # 3 DOS - 03/31/22 --- RIGHT FOOT REMOVAL OF SOFT TISSUE MASS, RIGHT ANKLE ARTHROSCOPY WITH TIBIAL EXOSTECTOMY, POSS ANKLE ARTHROTOMY, PATIENT HAS PAIN LOCATED AT THE SURGICAL SITE     DOS: 03/31/2022 Procedure: Right ankle arthroscopy with synovectomy and extensive debridement and removal of multiple loose bodies.  Right ankle soft tissue mass excision.  52 y.o. male returns for 3rd post-op check.  Patient is approximately 4 week status post right ankle arthroscopy with loose body excision and extensive debridement as well as soft tissue mass excision.  He states he is still experiencing pain in the right ankle.  Pain is worse with walking.  He is not walking in regular shoes.  Still having some swelling.  States that compression does help decrease the swelling however  Review of Systems: Negative except as noted in the HPI. Denies N/V/F/Ch.   Objective:  There were no vitals filed for this visit. There is no height or weight on file to calculate BMI. Constitutional Well developed. Well nourished.  Vascular Foot warm and well perfused. Capillary refill normal to all digits.  Calf is soft and supple, no posterior calf or knee pain, negative Homans' sign  Neurologic Normal speech. Oriented to person, place, and time. Epicritic sensation to light touch grossly present bilaterally.  Dermatologic Skin healing well without signs of infection. Skin edges well coapted without signs of infection.  No erythema or drainage  Orthopedic: tenderness to palpation noted about the surgical site especially at the lateral ankle portal.  There is slightly decreased edema of the right ankle.  Patient does have active plantarflexion dorsiflexion range of motion though limited due to swelling and pain   Multiple view plain film radiographs: 04/30/2022 XR 3 views  AP lateral bleak of the right foot.  Findings: on the lateral view there is noted to be osteophyte present at the anterior aspect of the tibiotalar joint.  Unclear if this is residual osteophyte present from preoperatively or new piece that is broken off since last radiographs.  Significant edema noted of the right ankle. Assessment:   1. Post-operative state   2. Ankle joint loose body, right   3. Arthritis of right ankle   4. Synovitis of right ankle     Plan:  Patient was evaluated and treated and all questions answered.  S/p foot surgery right ankle arthroscopy with debridement and soft tissue mass excision -Progressing slowly postoperatively still having pain not much improved from prior -XR: As above concern for residual osteophyte at the anterior aspect of the tibiotalar joint -WB Status: Weightbearing as in regular shoes -Right continue ankle range of motion exercises nonweightbearing dorsiflexion and plantarflexion to max limit -Medications: Ibuprofen as needed refill of ibuprofen sent recommend the patient take ibuprofento 800 mg 3 times a day -Discussed that if this pain is not improved we will have to consider further imaging in the form of a CT or MRI versus discussing additional operative intervention for the right ankle in the future.  Return in about 4 weeks (around 05/28/2022).         Corinna Gab, DPM [facility] / [facility]

## 2022-05-04 ENCOUNTER — Other Ambulatory Visit: Payer: Self-pay | Admitting: Podiatry

## 2022-05-04 DIAGNOSIS — Z9889 Other specified postprocedural states: Secondary | ICD-10-CM

## 2022-05-04 DIAGNOSIS — M659 Synovitis and tenosynovitis, unspecified: Secondary | ICD-10-CM

## 2022-05-04 DIAGNOSIS — M24071 Loose body in right ankle: Secondary | ICD-10-CM

## 2022-05-04 DIAGNOSIS — M67471 Ganglion, right ankle and foot: Secondary | ICD-10-CM

## 2022-05-04 DIAGNOSIS — M19071 Primary osteoarthritis, right ankle and foot: Secondary | ICD-10-CM

## 2022-05-17 ENCOUNTER — Ambulatory Visit (INDEPENDENT_AMBULATORY_CARE_PROVIDER_SITE_OTHER): Payer: Medicaid Other | Admitting: Family Medicine

## 2022-05-17 ENCOUNTER — Other Ambulatory Visit: Payer: Self-pay

## 2022-05-17 VITALS — BP 141/90 | HR 68 | Ht 70.0 in | Wt 290.0 lb

## 2022-05-17 DIAGNOSIS — M79671 Pain in right foot: Secondary | ICD-10-CM

## 2022-05-17 DIAGNOSIS — I10 Essential (primary) hypertension: Secondary | ICD-10-CM

## 2022-05-17 DIAGNOSIS — L989 Disorder of the skin and subcutaneous tissue, unspecified: Secondary | ICD-10-CM | POA: Diagnosis not present

## 2022-05-17 NOTE — Progress Notes (Unsigned)
    SUBJECTIVE:   CHIEF COMPLAINT / HPI:   HTN follow up Has been taking amlodipine and losartan-HCTZ as prescribed. No side effects. He is almost out of his medicines though and will need refills.   Foot pain  Pain has continued since his right ankle arthroscopy with debridement and soft tissue excision. Pain is worse during the day and gets better at night when he puts his feet up. He also has swelling in the area that resolves with elevation. He has been able to ambulate okay. He states he has taken ibuprofen BID for the last month or so consistently. Has also tried some tramadol that his friend has. He would like some more of this, as this helped his pain the most.  Skin lesion on R neck Has appointment with dermatology this month to get the lesion evaluated.  OBJECTIVE:   BP (!) 141/90   Pulse 68   Ht 5\' 10"  (1.778 m)   Wt 131.5 kg   SpO2 99%   BMI 41.61 kg/m   General: Alert and oriented, in NAD Skin: Warm, dry, and intact; well-healing post-op scars along R ankle HEENT: NCAT, EOM grossly normal, midline nasal septum Cardiac: RRR, no m/r/g appreciated Respiratory: CTAB, breathing and speaking comfortably on RA Abdominal: Soft, nontender, nondistended, normoactive bowel sounds Extremities: Moves all extremities grossly equally, R ankle with dependent swelling, no excessive erythema, warmth; mild pain with R foot eversion and with palpation overlying the area around cuboid bone  Neurological: No gross focal deficit; ambulates without difficulty Psychiatric: Appropriate mood and affect   ASSESSMENT/PLAN:   Foot pain Now is about 6 weeks postop. Appears stable in quality and on exam from podiatry follow up on 4/26. XR at that time demonstrated residual/new osteophyte formation at tibiotalar joint; however, pain on my exam appears more localized over area of cuboid bone. 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 It appears his podiatrist has broached the idea of CT/MRI of the foot +/- additional operative intervention should pain persist. I advised he follow up with his DPM. 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 I recommended tylenol 1000 mg TID prn, voltaren gel BID prn, and heating/cooling pads prn until his next [facility] appointment. He expressed understanding. Will follow up pain in 2 weeks or sooner if needed.  Hypertension At goal. Continue current medications.  Skin lesion Will follow up after dermatology appointment.  Janeal Holmes, MD [facility]

## 2022-05-17 NOTE — Patient Instructions (Signed)
It was great to see you today! Here's what we talked about:  Get the store brand diclofenac gel and place on your foot twice a day. Use tylenol 1000 mg three times a day as needed. Be sure to keep your foot elevated as much as possible. Use ice and heating pads as needed. Follow up with your surgeon if pain does not improve. I will send in your BP medications to your pharmacy.  Please let me know if you have any other questions.  Dr. Phineas Real

## 2022-05-18 MED ORDER — AMLODIPINE BESYLATE 2.5 MG PO TABS: 2.5000 mg | ORAL_TABLET | Freq: Every day | ORAL | 3 refills | Status: DC

## 2022-05-18 MED ORDER — LORATADINE 10 MG PO TABS: 10.0000 mg | ORAL_TABLET | Freq: Every day | ORAL | 11 refills | Status: DC

## 2022-05-18 MED ORDER — LOSARTAN POTASSIUM-HCTZ 50-12.5 MG PO TABS: 1.0000 | ORAL_TABLET | Freq: Every day | ORAL | 3 refills | Status: DC

## 2022-05-18 NOTE — Assessment & Plan Note (Signed)
Will follow up after dermatology appointment.

## 2022-05-18 NOTE — Assessment & Plan Note (Addendum)
At goal. Will refill and continue current medications.

## 2022-05-28 DIAGNOSIS — L989 Disorder of the skin and subcutaneous tissue, unspecified: Secondary | ICD-10-CM | POA: Diagnosis not present

## 2022-05-30 ENCOUNTER — Encounter: Payer: Self-pay | Admitting: Family Medicine

## 2022-06-07 ENCOUNTER — Ambulatory Visit (INDEPENDENT_AMBULATORY_CARE_PROVIDER_SITE_OTHER): Payer: Medicaid Other

## 2022-06-07 ENCOUNTER — Ambulatory Visit (INDEPENDENT_AMBULATORY_CARE_PROVIDER_SITE_OTHER): Payer: Medicaid Other | Admitting: Podiatry

## 2022-06-07 DIAGNOSIS — Z9889 Other specified postprocedural states: Secondary | ICD-10-CM

## 2022-06-07 DIAGNOSIS — M24071 Loose body in right ankle: Secondary | ICD-10-CM

## 2022-06-07 DIAGNOSIS — M19071 Primary osteoarthritis, right ankle and foot: Secondary | ICD-10-CM | POA: Diagnosis not present

## 2022-06-07 NOTE — Progress Notes (Signed)
Subjective:  Patient ID: Jose Mcgee, male    DOB: [birth year],  MRN: [id number]  Chief Complaint  Patient presents with   Routine Post Op    POV # 4 DOS - 03/31/22 --- RIGHT FOOT REMOVAL OF SOFT TISSUE MASS, RIGHT ANKLE ARTHROSCOPY WITH TIBIAL EXOSTECTOMY, POSS ANKLE ARTHROTOMY    DOS: 03/31/2022 Procedure: Right ankle arthroscopy with synovectomy and extensive debridement and removal of multiple loose bodies.  Right ankle soft tissue mass excision.  52 y.o. male returns for 4th  post-op check.  Patient is approximately 9 week status post right ankle arthroscopy with loose body excision and extensive debridement as well as soft tissue mass excision.  Patient reports his pain is decreased from prior.  He is still having some swelling and has some pain around the portal sites however he is able to walk now albeit with a slight limp but says that compared to preoperatively the pain is much decreased from what it was and he has better range of motion of the ankle.  He has full range of motion and can move the ankle up and down.    Review of Systems: Negative except as noted in the HPI. Denies N/V/F/Ch.   Objective:  There were no vitals filed for this visit. There is no height or weight on file to calculate BMI. Constitutional Well developed. Well nourished.  Vascular Foot warm and well perfused. Capillary refill normal to all digits.  Calf is soft and supple, no posterior calf or knee pain, negative Homans' sign  Neurologic Normal speech. Oriented to person, place, and time. Epicritic sensation to light touch grossly present bilaterally.  Dermatologic Skin healing well without signs of infection. Skin edges well coapted without signs of infection.  No erythema or drainage  Orthopedic: Minimal tenderness to palpation noted about the surgical site though pain is increased at the medial and especially lateral ankle portal.  There is slightly decreased edema of the right ankle.  Patient does  have active plantarflexion dorsiflexion range of motion which is not significantly limited by pain improved from prior.   Multiple view plain film radiographs: 6 /03/2022 XR 3 views AP lateral bleak of the right foot.  Findings: on the lateral view there is noted to be residual osteophyte present at the anterior aspect of the tibiotalar joint.  Decreased edema noted in the soft tissues from prior.   Assessment:   1. Post-operative state   2. Ankle joint loose body, right   3. Arthritis of right ankle     Plan:  Patient was evaluated and treated and all questions answered.  S/p foot surgery right ankle arthroscopy with debridement and soft tissue mass excision -Progressing slowly postoperatively, progressing at this appointment from prior.  He does report that compared to preoperatively his ankle does feel better overall he has increased motion and has less pain though he is still recovering from his surgery. -I recommended a steroid injection into the medial and lateral ankle joint around the ankle portals for scar tissue as well as residual inflammation in the area.  Patient was agreeable.  After alcohol prep injected 1 cc half percent Marcaine plain with 1 cc Kenalog 10. -XR: As above concern for residual osteophyte at the anterior aspect of the tibiotalar joint -unclear if this is significant at this time -WB Status: Weightbearing as in regular shoes -Right continue ankle range of motion exercises nonweightbearing dorsiflexion and plantarflexion to max limit -Medications: Tylenol as needed for pain -Patient continues to make slow  improvements from prior we will continue to monitor after the steroid injection I will see him back in a month.  Still will consider advanced imaging only if he worsens or fails to improve any further  Return in about 4 weeks (around 07/05/2022) for POV R ankle scope 13 weeks.         Corinna Gab, DPM [facility] / [facility]

## 2022-06-16 ENCOUNTER — Telehealth: Payer: Self-pay

## 2022-06-16 NOTE — Telephone Encounter (Signed)
Patient attempted to be outreached by Alesia Banda on 06/16/22 to discuss hypertension. Left voicemail for patient to return our call at their convenience at [phone].  Alesia Banda, Student-PharmD

## 2022-06-17 ENCOUNTER — Ambulatory Visit (INDEPENDENT_AMBULATORY_CARE_PROVIDER_SITE_OTHER): Payer: Medicaid Other | Admitting: Family Medicine

## 2022-06-17 VITALS — BP 129/86 | HR 60 | Ht 70.0 in | Wt 286.6 lb

## 2022-06-17 DIAGNOSIS — I10 Essential (primary) hypertension: Secondary | ICD-10-CM

## 2022-06-17 DIAGNOSIS — M6740 Ganglion, unspecified site: Secondary | ICD-10-CM

## 2022-06-17 DIAGNOSIS — R7989 Other specified abnormal findings of blood chemistry: Secondary | ICD-10-CM

## 2022-06-17 DIAGNOSIS — T7840XD Allergy, unspecified, subsequent encounter: Secondary | ICD-10-CM | POA: Diagnosis not present

## 2022-06-17 DIAGNOSIS — L989 Disorder of the skin and subcutaneous tissue, unspecified: Secondary | ICD-10-CM

## 2022-06-17 MED ORDER — LORATADINE 10 MG PO TABS: 10.0000 mg | ORAL_TABLET | Freq: Every day | ORAL | 5 refills | Status: DC

## 2022-06-17 MED ORDER — LORATADINE 10 MG PO TABS: 10.0000 mg | ORAL_TABLET | Freq: Every day | ORAL | 11 refills | Status: DC

## 2022-06-17 NOTE — Assessment & Plan Note (Signed)
Seems to have improved after removing an ingrown hair.  Advised patient to continue monitoring and following with dermatologist should he get worse.

## 2022-06-17 NOTE — Assessment & Plan Note (Signed)
Controlled on home amlodipine and losartan/HCTZ.  Continue current management.

## 2022-06-17 NOTE — Progress Notes (Signed)
    SUBJECTIVE:   CHIEF COMPLAINT / HPI:   Foot pain Had steroid injection with improved, albeit persistent, post-op pain on 06/07/2022 by DPM. They will still consider advanced imaging if pain continues. Has been using tylenol 1000 mg every day to every other day as needed. Also takes voltaren gel as needed.  Facial lesion Improving after removing the hair. Last saw the dermatologist about 2 weeks ago. Wanted to biopsy the area, but he would like to hold off on the appointment unless the area comes back.  HTN No issues taking home amlodipine and losartan/HCTZ.  Allergies Needs refill on claritin. Has been helping a lot. Has not had as bad morning congestion.  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  OBJECTIVE:   BP 144/93 on initial check after walking into room BP 129/86   Pulse 60   Ht 5\' 10"  (1.778 m)   Wt 286 lb 9.6 oz (130 kg)   SpO2 100%   BMI 41.12 kg/m   General: Alert and oriented, in NAD Skin: Warm, dry, and intact; slightly less prominent hyperpigmented and pearly lesion along left jawline without pain or discharge HEENT: NCAT, EOM grossly normal, midline nasal septum Cardiac: Regular rate Respiratory: Breathing and speaking comfortably on RA Abdominal: Nondistended Extremities: Moves all extremities grossly equally Neurological: No gross focal deficit Psychiatric: Appropriate mood and affect   ASSESSMENT/PLAN:   Hypertension Controlled on home amlodipine and losartan/HCTZ.  Continue current management.  Skin lesion Seems to have improved after removing an ingrown hair.  Advised patient to continue monitoring and following with dermatologist should he get worse.  Elevated serum creatinine Creatinine remains elevated on recent check in April 2024.  Will recheck microalbumin/protein ratio at next visit to ensure lack of rapid progression.  Continue to expect some element of CKD given  longstanding hypertension.  Allergies Refilled Claritin given good symptom control.  Ganglion cyst Pain is much improved postop after steroid injection by D.P.M.  Continue Tylenol and Voltaren as needed for pain management until follow-up.  Janeal Holmes, MD [facility]

## 2022-06-17 NOTE — Assessment & Plan Note (Signed)
Refilled Claritin given good symptom control.

## 2022-06-17 NOTE — Assessment & Plan Note (Signed)
Pain is much improved postop after steroid injection by D.P.M.  Continue Tylenol and Voltaren as needed for pain management until follow-up.

## 2022-06-17 NOTE — Assessment & Plan Note (Signed)
Creatinine remains elevated on recent check in April 2024.  Will recheck microalbumin/protein ratio at next visit to ensure lack of rapid progression.  Continue to expect some element of CKD given longstanding hypertension.

## 2022-06-17 NOTE — Patient Instructions (Signed)
It was great to see you today! Here's what we talked about:  I have refilled your claritin. Let me know how your facial lesion goes. I am glad it has improved. If it gets worse, I would keep the dermatology appointment. Keep up the great work with your blood pressure!  Please let me know if you have any other questions.  Dr. Phineas Real

## 2022-07-12 ENCOUNTER — Encounter: Payer: Medicaid Other | Admitting: Podiatry

## 2022-08-03 ENCOUNTER — Ambulatory Visit (INDEPENDENT_AMBULATORY_CARE_PROVIDER_SITE_OTHER): Payer: Medicaid Other | Admitting: Podiatry

## 2022-08-03 ENCOUNTER — Ambulatory Visit (INDEPENDENT_AMBULATORY_CARE_PROVIDER_SITE_OTHER): Payer: Medicaid Other

## 2022-08-03 DIAGNOSIS — M216X1 Other acquired deformities of right foot: Secondary | ICD-10-CM

## 2022-08-03 DIAGNOSIS — M659 Synovitis and tenosynovitis, unspecified: Secondary | ICD-10-CM

## 2022-08-03 DIAGNOSIS — M24071 Loose body in right ankle: Secondary | ICD-10-CM

## 2022-08-03 DIAGNOSIS — M19071 Primary osteoarthritis, right ankle and foot: Secondary | ICD-10-CM

## 2022-08-03 DIAGNOSIS — Z9889 Other specified postprocedural states: Secondary | ICD-10-CM | POA: Diagnosis not present

## 2022-08-03 NOTE — Progress Notes (Signed)
Subjective:  Patient ID: Jose Mcgee, male    DOB: Sep 17, 1970,  MRN: [id number]  Chief Complaint  Patient presents with   Routine Post Op    POV # 5 DOS - 03/31/22 --- RIGHT FOOT REMOVAL OF SOFT TISSUE MASS, RIGHT ANKLE ARTHROSCOPY WITH TIBIAL EXOSTECTOMY, POSS ANKLE ARTHROTOMY    DOS: 03/31/2022 Procedure: Right ankle arthroscopy with synovectomy and extensive debridement and removal of multiple loose bodies.  Right ankle soft tissue mass excision.  52 y.o. male returns for 5th  post-op check.  Patient is approximately 16 week status post right ankle arthroscopy with loose body excision and extensive debridement as well as soft tissue mass excision.  Patient reports he is overall doing well aside from when his ankle swelled up after he has been working on his foot all day.  He reports that the pain at the site of surgery is decreased from preoperatively and not really bothering him at all at this point is more so when his ankle swells up after he has been on his feet all day.  He then notices pain on the inside and outside of the ankle.  He is now wearing a brace.  Review of Systems: Negative except as noted in the HPI. Denies N/V/F/Ch.   Objective:  There were no vitals filed for this visit. There is no height or weight on file to calculate BMI. Constitutional Well developed. Well nourished.  Vascular Foot warm and well perfused. Capillary refill normal to all digits.  Calf is soft and supple, no posterior calf or knee pain, negative Homans' sign  Neurologic Normal speech. Oriented to person, place, and time. Epicritic sensation to light touch grossly present bilaterally.  Dermatologic Skin incisions well-healed without signs of infection.  The area with prior ganglion cyst is fully resolved no recurrence of soft tissue lesion at the area at the anterior lateral aspect of the proximal ankle  Orthopedic: Minimal tenderness to palpation noted about the surgical site though pain is still  present on the medial and lateral ankle only when the patient states the ankle swelling.  There is decreased edema noted to the ankle at this time improved from prior.  Patient does have active plantarflexion dorsiflexion range of motion which is not significantly limited by pain improved from prior.   Multiple view plain film radiographs: 08/03/2022 XR 3 views AP lateral bleak of the right foot.  Findings: on the lateral view there is noted to be residual osteophyte present at the anterior aspect of the tibiotalar joint.  Decreased edema noted in the soft tissues from prior.  Still with arthritic changes in the form of osseous spurring subchondral sclerosis joint space abnormality narrowing noted about the tibiotalar joint especially at the anterior aspect of the ankle joint. Assessment:   1. Post-operative state   2. Ankle joint loose body, right   3. Arthritis of right ankle   4. Synovitis of right ankle     Plan:  Patient was evaluated and treated and all questions answered.  S/p foot surgery right ankle arthroscopy with debridement and soft tissue mass excision -Continues to exhibit some pain especially when the ankle is swollen. -Recommend we proceed with a steroid injection at this visit which patient was agreeable as he has had success with steroid injection in the past -After sterile Prep injected 1 cc half percent Marcaine plain with 1 cc Kenalog 10 and 1 cc triamcinolone into the medial and lateral aspect of the ankle joint split 50-50. -XR: As above  still with concern for residual osteoarthritic changes throughout the tibiotalar joint however slightly improved from prior. -WB Status: Weightbearing as in regular shoes -Right continue ankle range of motion exercises nonweightbearing dorsiflexion and plantarflexion to max limit -Medications: Tylenol as needed for pain -Patient is continuing to show improvement at the surgical site however he does still have pain when the ankle swells.   He is not currently wearing the compression stocking or a brace.  Recommend both of these for the patient and dispensed Tri-Lock ankle brace to the patient at this visit -Discussed possible further surgical options if he continues to have pain including ankle joint arthrodesis versus ankle joint replacement.  Discussed that I am not currently performing ankle joint replacement and if he wants this procedure he would have to be referred out.  Also discussed the possibility of ankle arthrodesis.  -Will see the patient back in a month and determine how he is doing if he needs further imaging to prepare for possible additional surgery.  Return in about 4 weeks (around 08/31/2022) for f/u R ankle pain, post op.         Corinna Gab, DPM [facility] / [facility]

## 2022-09-07 ENCOUNTER — Ambulatory Visit: Payer: Medicaid Other | Admitting: Podiatry

## 2022-09-13 ENCOUNTER — Ambulatory Visit (INDEPENDENT_AMBULATORY_CARE_PROVIDER_SITE_OTHER): Payer: Medicaid Other

## 2022-09-13 ENCOUNTER — Ambulatory Visit (INDEPENDENT_AMBULATORY_CARE_PROVIDER_SITE_OTHER): Payer: Medicaid Other | Admitting: Podiatry

## 2022-09-13 DIAGNOSIS — M76821 Posterior tibial tendinitis, right leg: Secondary | ICD-10-CM

## 2022-09-13 DIAGNOSIS — M216X1 Other acquired deformities of right foot: Secondary | ICD-10-CM

## 2022-09-13 DIAGNOSIS — M7751 Other enthesopathy of right foot: Secondary | ICD-10-CM

## 2022-09-13 DIAGNOSIS — Z9889 Other specified postprocedural states: Secondary | ICD-10-CM | POA: Diagnosis not present

## 2022-09-13 DIAGNOSIS — M19071 Primary osteoarthritis, right ankle and foot: Secondary | ICD-10-CM

## 2022-09-13 DIAGNOSIS — M24071 Loose body in right ankle: Secondary | ICD-10-CM

## 2022-09-13 NOTE — Progress Notes (Signed)
Subjective:  Patient ID: Jose Mcgee, male    DOB: 11-05-1970,  MRN: [id number]  Chief Complaint  Patient presents with   Wound Check    POV #6 DOS - 03/31/22 --- RIGHT FOOT REMOVAL OF SOFT TISSUE MASS, RIGHT ANKLE ARTHROSCOPY WITH TIBIAL EXOSTECTOMY, POSS ANKLE ARTHROTOMY. Denies any pain to the incision site. He is having constant pain to the medial aspect of right ankle.     DOS: 03/31/2022 Procedure: Right ankle arthroscopy with synovectomy and extensive debridement and removal of multiple loose bodies.  Right ankle soft tissue mass excision.  52 y.o. male returns for 6th  post-op check.  Patient is approximately 5 mo status post right ankle arthroscopy with loose body excision and extensive debridement as well as soft tissue mass excision.  Patient reports he is overall doing well states his swelling is decreased He reports that the pain at the site of surgery is decreased from preoperatively and not really bothering him at all at this point is more so when his ankle swells up after he has been on his feet all day.   Patient does have some pain on the inside of the ankle when he is after working all day on his feet.  No longer using ankle brace.  Does not use compression sock or stocking  Review of Systems: Negative except as noted in the HPI. Denies N/V/F/Ch.   Objective:  There were no vitals filed for this visit. There is no height or weight on file to calculate BMI. Constitutional Well developed. Well nourished.  Vascular Foot warm and well perfused. Capillary refill normal to all digits.  Calf is soft and supple, no posterior calf or knee pain, negative Homans' sign  Neurologic Normal speech. Oriented to person, place, and time. Epicritic sensation to light touch grossly present bilaterally.  Dermatologic Skin incisions well-healed without signs of infection.  The area with prior ganglion cyst is fully resolved no recurrence of soft tissue lesion at the area at the anterior  lateral aspect of the proximal ankle  Orthopedic: Minimal tenderness to palpation noted about the surgical site though pain is still present on the medial ankle only when the patient states the ankle swelling.  There is decreased edema noted to the ankle at this time improved from prior.  Patient does have active plantarflexion dorsiflexion range of motion which is not significantly limited by pain improved from prior.   Multiple view plain film radiographs: 09/13/2022 XR 3 views AP lateral bleak of the right ankle.  Findings: on the lateral view there is noted to be minimal residual osteophyte present at the anterior aspect of the tibiotalar joint.  Decreased edema noted in the soft tissues from prior.  Still with arthritic changes in the form of osseous spurring subchondral sclerosis joint space abnormality narrowing noted about the tibiotalar joint especially at the anterior aspect of the ankle joint. Assessment:   1. Arthritis of right ankle   2. Posterior tibial tendon dysfunction (PTTD) of right lower extremity   3. Tendinitis of right ankle   4. Ankle joint loose body, right   5. Post-operative state     Plan:  Patient was evaluated and treated and all questions answered.  #S/p foot surgery right ankle arthroscopy with debridement and soft tissue mass excision # Posterior tibial tendinitis right ankle -Overall much improved from prior he says he is very happy with the surgical outcome -Has some mild pain along the course of the posterior tibial tendon -Recommend we proceed with  a steroid injection for posterior tibial tendinitis at this visit -After sterile Prep injected 1 cc half percent Marcaine plain with 1 cc Kenalog 10 and 1 cc triamcinolone along the course the posterior tibial tendon on the medial ankle -XR: Overall slightly improved from prior still with osteoarthritic changes at the anterior aspect of the ankle joint -WB Status: Weightbearing as in regular shoes -recommend  compression stocking when working especially on the right ankle - continue ankle range of motion exercises nonweightbearing dorsiflexion and plantarflexion to max limit -Medications: Tylenol as needed for pain -Patient is continuing to show improvement at the surgical site however he does still have pain when the ankle swells.  He is not currently wearing the compression stocking or a brace.  Recommend both of these for the patient and dispensed Tri-Lock ankle brace to the patient at this visit -Overall patient is improved at this time he does not think he needs any additional surgery and is overall happy with the surgical outcome recommend continued anti-inflammatory medications including Advil and Voltaren gel for the anterior medial ankle as needed -Patient is now discharged from this surgical procedure if he has continuing or worsening pain will him to call and we will get him back into evaluate further.        Corinna Gab, DPM [facility] / [facility]

## 2022-09-21 ENCOUNTER — Encounter: Payer: Self-pay | Admitting: Family Medicine

## 2022-09-21 ENCOUNTER — Ambulatory Visit (INDEPENDENT_AMBULATORY_CARE_PROVIDER_SITE_OTHER): Payer: Medicaid Other | Admitting: Family Medicine

## 2022-09-21 VITALS — BP 138/87 | HR 64 | Ht 70.0 in | Wt 271.2 lb

## 2022-09-21 DIAGNOSIS — M6740 Ganglion, unspecified site: Secondary | ICD-10-CM | POA: Diagnosis not present

## 2022-09-21 DIAGNOSIS — T7840XD Allergy, unspecified, subsequent encounter: Secondary | ICD-10-CM

## 2022-09-21 DIAGNOSIS — I10 Essential (primary) hypertension: Secondary | ICD-10-CM

## 2022-09-21 MED ORDER — AMLODIPINE BESYLATE 5 MG PO TABS: 5.0000 mg | ORAL_TABLET | Freq: Every day | ORAL | 3 refills | Status: AC

## 2022-09-21 MED ORDER — LOSARTAN POTASSIUM-HCTZ 50-12.5 MG PO TABS: 1.0000 | ORAL_TABLET | Freq: Every day | ORAL | 3 refills | Status: AC

## 2022-09-21 NOTE — Assessment & Plan Note (Addendum)
BP closer to goal of less than 130/80.  Increase amlodipine from 2.5 to 5 mg daily.  Continue losartan-HCTZ 50-12.5 mg daily.  Will send in 90-day refills of both.  Follow-up in 1 month.

## 2022-09-21 NOTE — Progress Notes (Signed)
    SUBJECTIVE:   CHIEF COMPLAINT / HPI:   HTN Taking amlodipine and losartan-HCTZ as prescribed.  Did have multiple caffeine sources this morning he feels his blood pressure will be a little elevated.  Ganglion cyst, foot pain Now multiple weeks status post surgical intervention with podiatry.  Patient is very happy with results.  His pain is much improved.  Allergies No longer on any allergy medication.  Had multiple teeth removed with dentistry and says that improved his sinus congestion.  OBJECTIVE:   BP 138/87   Pulse 64   Ht 5\' 10"  (1.778 m)   Wt 271 lb 3.2 oz (123 kg)   SpO2 99%   BMI 38.91 kg/m   General: Alert and oriented, in NAD Skin: Warm, dry, and intact without lesions HEENT: NCAT, EOM grossly normal, midline nasal septum Cardiac: Regular rate Respiratory: Breathing and speaking comfortably on RA Extremities: Moves all extremities grossly equally Neurological: No gross focal deficit Psychiatric: Appropriate mood and affect   ASSESSMENT/PLAN:   Hypertension BP closer to goal of less than 130/80.  Increase amlodipine from 2.5 to 5 mg daily.  Continue losartan-HCTZ 50-12.5 mg daily.  Will send in 90-day refills of both.  Follow-up in 1 month.  Ganglion cyst Improving.  Continue to follow with podiatry.  Allergies Improved without medications.  Follow-up as needed.   Health maintenance Patient declines flu, COVID, shingles vaccines.  Janeal Holmes, MD [facility]

## 2022-09-21 NOTE — Addendum Note (Signed)
Addended by: Evette Georges B on: 09/21/2022 02:10 PM   Modules accepted: Orders

## 2022-09-21 NOTE — Assessment & Plan Note (Signed)
Improving.  Continue to follow with podiatry.

## 2022-09-21 NOTE — Patient Instructions (Addendum)
It was great to see you today! Here's what we talked about:  Glad to hear your foot and sinuses are better. Keep taking your blood pressure medications! Follow up in 1 month or sooner if needed. I will send in amlodipine 5 mg daily and refill your losartan-hydrochlorothiazide.  Please let me know if you have any other questions.  Dr. Phineas Real

## 2022-09-21 NOTE — Assessment & Plan Note (Signed)
Improved without medications.  Follow-up as needed.

## 2022-10-19 ENCOUNTER — Ambulatory Visit: Payer: Medicaid Other | Admitting: Family Medicine

## 2022-11-19 ENCOUNTER — Ambulatory Visit (INDEPENDENT_AMBULATORY_CARE_PROVIDER_SITE_OTHER): Payer: Medicaid Other | Admitting: Family Medicine

## 2022-11-19 ENCOUNTER — Encounter: Payer: Self-pay | Admitting: Family Medicine

## 2022-11-19 VITALS — BP 128/76 | HR 74 | Ht 70.0 in | Wt 265.0 lb

## 2022-11-19 DIAGNOSIS — I10 Essential (primary) hypertension: Secondary | ICD-10-CM

## 2022-11-19 DIAGNOSIS — H532 Diplopia: Secondary | ICD-10-CM

## 2022-11-19 DIAGNOSIS — R42 Dizziness and giddiness: Secondary | ICD-10-CM | POA: Diagnosis not present

## 2022-11-19 NOTE — Patient Instructions (Signed)
We will refer to ophthalmology for their opinion. I will also order an MRI of your brain. Contact me in 2 weeks if you do not hear back from the ophthalmologist. Go to the ED if this happens again, and let us know! GREAT JOB ON THE BLOOD PRESSURE!

## 2022-11-19 NOTE — Progress Notes (Signed)
    SUBJECTIVE:   CHIEF COMPLAINT / HPI:   Hypertension follow-up Has been taking his amlodipine and Hyzaar as prescribed.  Very proud of his progress.  Double vision, dizziness Happened 2 weeks ago while working outside.  He took a break in his truck and the episode went away.  No more episodes have occurred.  No headache.  Does not endorse motor or sensory changes.  No trauma. Feels he may have been a little dehydrated.  Felt symptoms were worse whenever he stood up.  Has never had this before.  Feels it could be vertigo.  PERTINENT  PMH / PSH: Tinea versicolor, chronic lower back pain  OBJECTIVE:   BP 128/76   Pulse 74   Ht 5\' 10"  (1.778 m)   Wt 265 lb (120.2 kg)   SpO2 99%   BMI 38.02 kg/m   General: Alert and oriented, in NAD Skin: Warm, dry HEENT: NCAT, EOM grossly normal, midline nasal septum, yellow plaques along midline sclera bilaterally (in media tab) Cardiac: Regular rate Respiratory: CTAB, breathing and speaking comfortably on RA Abdominal: Nondistended, normoactive bowel sounds Neurological: Cranial nerves intact though dizziness reproducible with downward gaze, downward gaze noted to be symmetric, no nystagmus noted on my exam, strength and sensation intact throughout, gait normal, Dix-Hallpike maneuver negative Psychiatric: Appropriate mood and affect   ASSESSMENT/PLAN:   Hypertension Well-controlled on amlodipine, Hyzaar.  Continue current management.  Dizziness Now resolved though interestingly reproducible with downward gaze. Less likely vertigo, nystagmus, vestibular neuritis, orthostasis given otherwise reassuring history and neurological exam.  He does have risk factors for TIA/stroke, and this could have affected a small part of the brainstem.  Will proceed with MRI brain, as this is medically necessary to rule out stroke and need for aggressive medical management.  Will also refer to ophthalmology for further input.  Follow-up depending on scan results.   Discussed going to ED should this happen again.  Janeal Holmes, MD [facility]

## 2022-11-19 NOTE — Assessment & Plan Note (Signed)
Well-controlled on amlodipine, Hyzaar.  Continue current management.

## 2022-11-19 NOTE — Assessment & Plan Note (Signed)
Now resolved though interestingly reproducible with downward gaze. Less likely vertigo, nystagmus, vestibular neuritis, orthostasis given otherwise reassuring history and neurological exam.  He does have risk factors for TIA/stroke, and this could have affected a small part of the brainstem.  Will proceed with MRI brain, as this is medically necessary to rule out stroke and need for aggressive medical management.  Will also refer to ophthalmology for further input.  Follow-up depending on scan results.  Discussed going to ED should this happen again.

## 2022-11-23 ENCOUNTER — Telehealth: Payer: Self-pay

## 2022-11-23 NOTE — Telephone Encounter (Signed)
-----   Message from [facility] Jasmine December S sent at 11/23/2022 10:57 AM EST ----- Ok to schedule at [facility], This has been approved. Authorization number is [id number]. Valid until 01/07/2023. Melvenia Beam ----- Message ----- From: Veronda Prude, RN Sent: 11/22/2022  12:30 PM EST To: Sunday Spillers, CMA  Lyman Speller,   Are you able to help with the PA for the Brain MRI? ----- Message ----- From: Evette Georges, MD Sent: 11/19/2022   4:47 PM EST To: Veronda Prude, RN  Can we make sure he gets scheduiled for this? Do y'all help him with this? Thank yoU!!

## 2022-11-23 NOTE — Telephone Encounter (Signed)
Called to schedule MRI for patient.  Called patient and left a generic VM for patient to call office back.  When patient calls back inform him about an upcomping appointment for MRI at [facility] on Friday, November 22nd at 4pm but he would need to get there at 3:30pm.  Thanks  Drusilla Kanner, CMA

## 2022-11-26 ENCOUNTER — Ambulatory Visit (HOSPITAL_COMMUNITY)
Admission: RE | Admit: 2022-11-26 | Discharge: 2022-11-26 | Disposition: A | Discharge status: Home / Self Care | Payer: Medicaid Other | Source: Ambulatory Visit | Attending: Family Medicine | Admitting: Family Medicine

## 2022-11-26 DIAGNOSIS — H532 Diplopia: Secondary | ICD-10-CM | POA: Diagnosis present

## 2022-11-26 DIAGNOSIS — R42 Dizziness and giddiness: Secondary | ICD-10-CM | POA: Insufficient documentation

## 2022-11-26 MED ORDER — GADOBUTROL 1 MMOL/ML IV SOLN
8.0000 mL | Freq: Once | INTRAVENOUS | Status: AC | PRN
  Administered 2022-11-26: 8 mL via INTRAVENOUS

## 2022-12-06 ENCOUNTER — Telehealth: Payer: Self-pay | Admitting: Family Medicine

## 2022-12-06 NOTE — Telephone Encounter (Signed)
Attempted to call patient to inform of MRI results after notification of unreviewed MyChart message (below). Please schedule patient for an appointment to discuss results when he calls back.  "Hi Jose Mcgee, your MRI did not show any new stroke.  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  I would still recommend staying hydrated and following up with ophthalmology.  Continue working on good diet and exercise habits as well.  We will likely need to repeat your cholesterol panel soon and likely start you on a medication for this.  Can you schedule a follow-up at your earliest convenience to chat more?  Also, let me know if you are getting worse before you follow-up with me or with the ophthalmologist! -Dr. Phineas Real"

## 2024-01-30 ENCOUNTER — Ambulatory Visit: Payer: Self-pay | Admitting: Family Medicine

## 2024-02-07 ENCOUNTER — Ambulatory Visit: Admitting: Family Medicine

## 2024-02-07 ENCOUNTER — Encounter: Payer: Self-pay | Admitting: Family Medicine

## 2024-02-22 ENCOUNTER — Ambulatory Visit: Admitting: Family Medicine
